# Patient Record
Sex: Male | Born: 1935 | Race: Black or African American | Hispanic: No | Marital: Married | Smoking: Former smoker
Health system: Southern US, Community
[De-identification: ages and names within clinical notes are randomized; demographics above are authoritative.]

## PROBLEM LIST (undated history)

## (undated) DIAGNOSIS — E119 Type 2 diabetes mellitus without complications: Secondary | ICD-10-CM

## (undated) DIAGNOSIS — I10 Essential (primary) hypertension: Secondary | ICD-10-CM

## (undated) DIAGNOSIS — N390 Urinary tract infection, site not specified: Secondary | ICD-10-CM

## (undated) HISTORY — PX: MULTIPLE TOOTH EXTRACTIONS: SHX2053

---

## 2000-01-24 ENCOUNTER — Ambulatory Visit (HOSPITAL_COMMUNITY): Admission: RE | Admit: 2000-01-24 | Discharge: 2000-01-24 | Payer: Self-pay | Admitting: Gastroenterology

## 2000-09-11 ENCOUNTER — Encounter: Payer: Self-pay | Admitting: Emergency Medicine

## 2000-09-11 ENCOUNTER — Emergency Department (HOSPITAL_COMMUNITY): Admission: EM | Admit: 2000-09-11 | Discharge: 2000-09-11 | Payer: Self-pay | Admitting: Emergency Medicine

## 2000-10-29 ENCOUNTER — Encounter: Admission: RE | Admit: 2000-10-29 | Discharge: 2000-10-29 | Payer: Self-pay | Admitting: Family Medicine

## 2000-10-29 ENCOUNTER — Encounter: Payer: Self-pay | Admitting: Family Medicine

## 2004-03-13 ENCOUNTER — Ambulatory Visit (HOSPITAL_COMMUNITY): Admission: RE | Admit: 2004-03-13 | Discharge: 2004-03-13 | Payer: Self-pay | Admitting: Gastroenterology

## 2013-07-11 ENCOUNTER — Encounter (INDEPENDENT_AMBULATORY_CARE_PROVIDER_SITE_OTHER): Payer: Medicare Other | Admitting: Ophthalmology

## 2013-07-11 DIAGNOSIS — E1165 Type 2 diabetes mellitus with hyperglycemia: Secondary | ICD-10-CM

## 2013-07-11 DIAGNOSIS — I10 Essential (primary) hypertension: Secondary | ICD-10-CM

## 2013-07-11 DIAGNOSIS — H35039 Hypertensive retinopathy, unspecified eye: Secondary | ICD-10-CM

## 2013-07-11 DIAGNOSIS — H43819 Vitreous degeneration, unspecified eye: Secondary | ICD-10-CM

## 2013-07-11 DIAGNOSIS — E11319 Type 2 diabetes mellitus with unspecified diabetic retinopathy without macular edema: Secondary | ICD-10-CM

## 2013-07-11 DIAGNOSIS — E1139 Type 2 diabetes mellitus with other diabetic ophthalmic complication: Secondary | ICD-10-CM

## 2013-07-11 DIAGNOSIS — H3530 Unspecified macular degeneration: Secondary | ICD-10-CM

## 2018-01-05 ENCOUNTER — Ambulatory Visit: Payer: Medicare Other | Admitting: Family Medicine

## 2018-01-05 ENCOUNTER — Encounter: Payer: Self-pay | Admitting: Family Medicine

## 2018-01-05 ENCOUNTER — Other Ambulatory Visit: Payer: Self-pay

## 2018-01-05 VITALS — BP 134/80 | HR 71 | Temp 98.4°F | Ht 69.0 in | Wt 210.0 lb

## 2018-01-05 DIAGNOSIS — E119 Type 2 diabetes mellitus without complications: Secondary | ICD-10-CM

## 2018-01-05 DIAGNOSIS — I10 Essential (primary) hypertension: Secondary | ICD-10-CM

## 2018-01-05 MED ORDER — GLIMEPIRIDE 4 MG PO TABS: 4.0000 mg | ORAL_TABLET | Freq: Every day | ORAL | 0 refills | Status: DC

## 2018-01-05 MED ORDER — LOSARTAN POTASSIUM-HCTZ 100-12.5 MG PO TABS: 1.0000 | ORAL_TABLET | Freq: Every day | ORAL | 0 refills | Status: DC

## 2018-01-05 NOTE — Patient Instructions (Addendum)
I will hold on labs today until we can review records from prior provider. Please follow up in next month to review notes and possible labs.   See info on diabetes below. Try low intensity exercise such as walking most days per week.    Type 2 Diabetes Mellitus, Self Care, Adult When you have type 2 diabetes (type 2 diabetes mellitus), you must keep your blood sugar (glucose) under control. You can do this with:  Nutrition.  Exercise.  Lifestyle changes.  Medicines or insulin, if needed.  Support from your doctors and others.  How do I manage my blood sugar?  Check your blood sugar level every day, as often as told.  Call your doctor if your blood sugar is above your goal numbers for 2 tests in a row.  Have your A1c (hemoglobin A1c) level checked at least two times a year. Have it checked more often if your doctor tells you to. Your doctor will set treatment goals for you. Generally, you should have these blood sugar levels:  Before meals (preprandial): 80-130 mg/dL (4.4-7.2 mmol/L).  After meals (postprandial): lower than 180 mg/dL (10 mmol/L).  A1c level: less than 7%.  What do I need to know about high blood sugar? High blood sugar is called hyperglycemia. Know the signs of high blood sugar. Signs may include:  Feeling: ? Thirsty. ? Hungry. ? Very tired.  Needing to pee (urinate) more than usual.  Blurry vision.  What do I need to know about low blood sugar? Low blood sugar is called hypoglycemia. This is when blood sugar is at or below 70 mg/dL (3.9 mmol/L). Symptoms may include:  Feeling: ? Hungry. ? Worried or nervous (anxious). ? Sweaty and clammy. ? Confused. ? Dizzy. ? Sleepy. ? Sick to your stomach (nauseous).  Having: ? A fast heartbeat (palpitations). ? A headache. ? A change in your vision. ? Jerky movements that you cannot control (seizure). ? Nightmares. ? Tingling or no feeling (numbness) around the mouth, lips, or tongue.  Having  trouble with: ? Talking. ? Paying attention (concentrating). ? Moving (coordination). ? Sleeping.  Shaking.  Passing out (fainting).  Getting upset easily (irritability).  Treating low blood sugar  To treat low blood sugar, eat or drink something sugary right away. If you can think clearly and swallow safely, follow the 15:15 rule:  Take 15 grams of a fast-acting carb (carbohydrate). Some fast-acting carbs are: ? 1 tube of glucose gel. ? 3 sugar tablets (glucose pills). ? 6-8 pieces of hard candy. ? 4 oz (120 mL) of fruit juice. ? 4 oz (120 mL) regular (not diet) soda.  Check your blood sugar 15 minutes after you take the carb.  If your blood sugar is still at or below 70 mg/dL (3.9 mmol/L), take 15 grams of a carb again.  If your blood sugar does not go above 70 mg/dL (3.9 mmol/L) after 3 tries, get help right away.  After your blood sugar goes back to normal, eat a meal or a snack within 1 hour.  Treating very low blood sugar If your blood sugar is at or below 54 mg/dL (3 mmol/L), you have very low blood sugar (severe hypoglycemia). This is an emergency. Do not wait to see if the symptoms will go away. Get medical help right away. Call your local emergency services (911 in the [place] Do not drive yourself to the hospital. If you have very low blood sugar and you cannot eat or drink, you may need a  glucagon shot (injection). A family member or friend should learn how to check your blood sugar and how to give you a glucagon shot. Ask your doctor if you need to have a glucagon shot kit at home. What else is important to manage my diabetes? Medicine Follow these instructions about insulin and diabetes medicines:  Take them as told by your doctor.  Adjust them as told by your doctor.  Do not run out of them.  Having diabetes can raise your risk for other long-term conditions. These include heart or kidney disease. Your doctor may prescribe medicines to help prevent problems  from diabetes. Food   Make healthy food choices. These include: ? Chicken, fish, egg whites, and beans. ? Oats, whole wheat, bulgur, brown rice, quinoa, and millet. ? Fresh fruits and vegetables. ? Low-fat dairy products. ? Nuts, avocado, olive oil, and canola oil.  Make a food plan with a specialist (dietitian).  Follow instructions from your doctor about what you cannot eat or drink.  Drink enough fluid to keep your pee (urine) clear or pale yellow.  Eat healthy snacks between healthy meals.  Keep track of carbs that you eat. Read food labels. Learn food serving sizes.  Follow your sick day plan when you cannot eat or drink normally. Make this plan with your doctor so it is ready to use. Activity  Exercise at least 3 times a week.  Do not go more than 2 days without exercising.  Talk with your doctor before you start a new exercise. Your doctor may need to adjust your insulin, medicines, or food. Lifestyle   Do not use any tobacco products. These include cigarettes, chewing tobacco, and e-cigarettes.If you need help quitting, ask your doctor.  Ask your doctor how much alcohol is safe for you.  Learn to deal with stress. If you need help with this, ask your doctor. Body care  Stay up to date with your shots (immunizations).  Have your eyes and feet checked by a doctor as often as told.  Check your skin and feet every day. Check for cuts, bruises, redness, blisters, or sores.  Brush your teeth and gums two times a day.  Floss at least one time a day.  Go to the dentist least one time every 6 months.  Stay at a healthy weight. General instructions   Take over-the-counter and prescription medicines only as told by your doctor.  Share your diabetes care plan with: ? Your work or school. ? People you live with.  Check your pee (urine) for ketones: ? When you are sick. ? As told by your doctor.  Carry a card or wear jewelry that says that you have  diabetes.  Ask your doctor: ? Do I need to meet with a diabetes educator? ? Where can I find a support group for people with diabetes?  Keep all follow-up visits as told by your doctor. This is important. Where to find more information: To learn more about diabetes, visit:  American Diabetes Association: www.diabetes.org  American Association of Diabetes Educators: www.diabeteseducator.org/patient-resources  This information is not intended to replace advice given to you by your health care provider. Make sure you discuss any questions you have with your health care provider. Document Released: 09/24/2015 Document Revised: 11/08/2015 Document Reviewed: 07/06/2015 Elsevier Interactive Patient Education  2018 [employer].    IF you received an x-ray today, you will receive an invoice from [facility]. Please contact [facility] at [phone] with questions or concerns regarding your  invoice.   IF you received labwork today, you will receive an invoice from [place]. Please contact [facility] at [phone] with questions or concerns regarding your invoice.   Our billing staff will not be able to assist you with questions regarding bills from these companies.  You will be contacted with the lab results as soon as they are available. The fastest way to get your results is to activate your My Chart account. Instructions are located on the last page of this paperwork. If you have not heard from [place] regarding the results in 2 weeks, please contact this office.

## 2018-01-05 NOTE — Progress Notes (Signed)
Subjective:  By signing my name below, I, Essence Howell, attest that this documentation has been prepared under the direction and in the presence of Wendie Agreste, MD Electronically Signed: Ladene Artist, ED Scribe 01/05/2018 at 11:32 AM.   Patient ID: Lawrence Higgins, male    DOB: 12-Sep-1933, 84 y.o.   MRN: [id number]  Chief Complaint  Patient presents with  . Medication Refill    glimepiride and losartan-hydrochlorothiazide    HPI Lawrence Higgins is a 84 y.o. male who presents to Primary Care at [facility] for med refill. New pt to me. Has been followed by Dr. Tammi Klippel at [facility] for DM and HTN in 2015, Silvio Pate most recent [place] 07/2014 noted. Appears he has been seen by Dr. Kennon Holter since that time, last visit 2 months ago.  DM A1C had increased from 6.7 to 7.2 in 2016. Diet changes discussed. Planned for 3 month f/u. Continued on janumet 50-500 mg qd. Had been on Zocor for statin started 07/2014 and lisinopril for ACE inhibitor. - Janumet was discontinued due to cost. He is currently on Amaryl 4 mg bid with meals. Pt checks his blood glucose at home with readings averaging 140. Last A1C was ~1 month ago. Denies symptomatic lows, numbness/tingling in LEs. Last saw optho ~1 yr ago. He is not currently exercising.  HTN Prev in 2016 he was on Norvasc 5 mg qd, lisinopril 2.5 mg qd. - Lisinopril was discontinued due to cough. Denies swelling out lips/tongue, any other symptoms. Pt is currently taking Losartan-HCTZ 100-12.5 qd. Denies any side-effects. He has dentures; has not seen a dentist in several yrs.  Pt retired 14 yrs ago from [employer]  There are no active problems to display for this patient.  No past medical history on file.  Allergies  Allergen Reactions  . Tylenol [Acetaminophen] Nausea And Vomiting   Prior to Admission medications   Not on File   Social History   Socioeconomic History  . Marital status: Married    Spouse  name: Not on file  . Number of children: Not on file  . Years of education: Not on file  . Highest education level: Not on file  Occupational History  . Not on file  Social Needs  . Financial resource strain: Not on file  . Food insecurity:    Worry: Not on file    Inability: Not on file  . Transportation needs:    Medical: Not on file    Non-medical: Not on file  Tobacco Use  . Smoking status: Not on file  Substance and Sexual Activity  . Alcohol use: Not on file  . Drug use: Not on file  . Sexual activity: Not on file  Lifestyle  . Physical activity:    Days per week: Not on file    Minutes per session: Not on file  . Stress: Not on file  Relationships  . Social connections:    Talks on phone: Not on file    Gets together: Not on file    Attends religious service: Not on file    Active member of club or organization: Not on file    Attends meetings of clubs or organizations: Not on file    Relationship status: Not on file  . Intimate partner violence:    Fear of current or ex partner: Not on file    Emotionally abused: Not on file    Physically abused: Not on file    Forced  sexual activity: Not on file  Other Topics Concern  . Not on file  Social History Narrative  . Not on file   Review of Systems  Constitutional: Negative for fatigue and unexpected weight change.  Eyes: Negative for visual disturbance.  Respiratory: Negative for cough, chest tightness and shortness of breath.   Cardiovascular: Negative for chest pain, palpitations and leg swelling.  Gastrointestinal: Negative for abdominal pain and blood in stool.  Neurological: Negative for dizziness, light-headedness, numbness and headaches.      Objective:   Physical Exam  Constitutional: He is oriented to person, place, and time. He appears well-developed and well-nourished.  HENT:  Head: Normocephalic and atraumatic.  Eyes: Pupils are equal, round, and reactive to light. EOM are normal.  Neck: No JVD  present. Carotid bruit is not present.  Cardiovascular: Normal rate, regular rhythm and normal heart sounds.  No murmur heard. Pulmonary/Chest: Effort normal and breath sounds normal. He has no rales.  Musculoskeletal: He exhibits no edema.  Neurological: He is alert and oriented to person, place, and time.  Skin: Skin is warm and dry.  Psychiatric: He has a normal mood and affect.  Vitals reviewed.  Vitals:   01/05/18 1103 01/05/18 1109  BP: (!) 154/88 134/80  Pulse: 71   Temp: 98.4 F (36.9 C)   TempSrc: Oral   SpO2: 90%   Weight: 210 lb (95.3 kg)   Height: '5\' 9"'  (1.753 m)       Assessment & Plan:    Lawrence Higgins is a 84 y.o. male Essential hypertension  - controlled. Continue current regimen can review prior records to determine lab need. Recheck in 1 month.   Type 2 diabetes mellitus without complication, without long-term current use of insulin (HCC)  - tolerating regimen without hypoglycemia. Will review prior records to determine needed labs. Handout on self care given. Recheck in 1 month. Low intensity exercise discussed.   Meds ordered this encounter  Medications  . losartan-hydrochlorothiazide (HYZAAR) 100-12.5 MG tablet    Sig: Take 1 tablet by mouth daily.    Dispense:  90 tablet    Refill:  0  . glimepiride (AMARYL) 4 MG tablet    Sig: Take 1 tablet (4 mg total) by mouth daily with breakfast.    Dispense:  180 tablet    Refill:  0   Patient Instructions   I will hold on labs today until we can review records from prior provider. Please follow up in next month to review notes and possible labs.   See info on diabetes below. Try low intensity exercise such as walking most days per week.    Type 2 Diabetes Mellitus, Self Care, Adult When you have type 2 diabetes (type 2 diabetes mellitus), you must keep your blood sugar (glucose) under control. You can do this with:  Nutrition.  Exercise.  Lifestyle changes.  Medicines or insulin, if  needed.  Support from your doctors and others.  How do I manage my blood sugar?  Check your blood sugar level every day, as often as told.  Call your doctor if your blood sugar is above your goal numbers for 2 tests in a row.  Have your A1c (hemoglobin A1c) level checked at least two times a year. Have it checked more often if your doctor tells you to. Your doctor will set treatment goals for you. Generally, you should have these blood sugar levels:  Before meals (preprandial): 80-130 mg/dL (4.4-7.2 mmol/L).  After meals (postprandial): lower than  180 mg/dL (10 mmol/L).  A1c level: less than 7%.  What do I need to know about high blood sugar? High blood sugar is called hyperglycemia. Know the signs of high blood sugar. Signs may include:  Feeling: ? Thirsty. ? Hungry. ? Very tired.  Needing to pee (urinate) more than usual.  Blurry vision.  What do I need to know about low blood sugar? Low blood sugar is called hypoglycemia. This is when blood sugar is at or below 70 mg/dL (3.9 mmol/L). Symptoms may include:  Feeling: ? Hungry. ? Worried or nervous (anxious). ? Sweaty and clammy. ? Confused. ? Dizzy. ? Sleepy. ? Sick to your stomach (nauseous).  Having: ? A fast heartbeat (palpitations). ? A headache. ? A change in your vision. ? Jerky movements that you cannot control (seizure). ? Nightmares. ? Tingling or no feeling (numbness) around the mouth, lips, or tongue.  Having trouble with: ? Talking. ? Paying attention (concentrating). ? Moving (coordination). ? Sleeping.  Shaking.  Passing out (fainting).  Getting upset easily (irritability).  Treating low blood sugar  To treat low blood sugar, eat or drink something sugary right away. If you can think clearly and swallow safely, follow the 15:15 rule:  Take 15 grams of a fast-acting carb (carbohydrate). Some fast-acting carbs are: ? 1 tube of glucose gel. ? 3 sugar tablets (glucose pills). ? 6-8  pieces of hard candy. ? 4 oz (120 mL) of fruit juice. ? 4 oz (120 mL) regular (not diet) soda.  Check your blood sugar 15 minutes after you take the carb.  If your blood sugar is still at or below 70 mg/dL (3.9 mmol/L), take 15 grams of a carb again.  If your blood sugar does not go above 70 mg/dL (3.9 mmol/L) after 3 tries, get help right away.  After your blood sugar goes back to normal, eat a meal or a snack within 1 hour.  Treating very low blood sugar If your blood sugar is at or below 54 mg/dL (3 mmol/L), you have very low blood sugar (severe hypoglycemia). This is an emergency. Do not wait to see if the symptoms will go away. Get medical help right away. Call your local emergency services (911 in the [place] Do not drive yourself to the hospital. If you have very low blood sugar and you cannot eat or drink, you may need a glucagon shot (injection). A family member or friend should learn how to check your blood sugar and how to give you a glucagon shot. Ask your doctor if you need to have a glucagon shot kit at home. What else is important to manage my diabetes? Medicine Follow these instructions about insulin and diabetes medicines:  Take them as told by your doctor.  Adjust them as told by your doctor.  Do not run out of them.  Having diabetes can raise your risk for other long-term conditions. These include heart or kidney disease. Your doctor may prescribe medicines to help prevent problems from diabetes. Food   Make healthy food choices. These include: ? Chicken, fish, egg whites, and beans. ? Oats, whole wheat, bulgur, brown rice, quinoa, and millet. ? Fresh fruits and vegetables. ? Low-fat dairy products. ? Nuts, avocado, olive oil, and canola oil.  Make a food plan with a specialist (dietitian).  Follow instructions from your doctor about what you cannot eat or drink.  Drink enough fluid to keep your pee (urine) clear or pale yellow.  Eat healthy snacks between  healthy meals.  Keep track of carbs that you eat. Read food labels. Learn food serving sizes.  Follow your sick day plan when you cannot eat or drink normally. Make this plan with your doctor so it is ready to use. Activity  Exercise at least 3 times a week.  Do not go more than 2 days without exercising.  Talk with your doctor before you start a new exercise. Your doctor may need to adjust your insulin, medicines, or food. Lifestyle   Do not use any tobacco products. These include cigarettes, chewing tobacco, and e-cigarettes.If you need help quitting, ask your doctor.  Ask your doctor how much alcohol is safe for you.  Learn to deal with stress. If you need help with this, ask your doctor. Body care  Stay up to date with your shots (immunizations).  Have your eyes and feet checked by a doctor as often as told.  Check your skin and feet every day. Check for cuts, bruises, redness, blisters, or sores.  Brush your teeth and gums two times a day.  Floss at least one time a day.  Go to the dentist least one time every 6 months.  Stay at a healthy weight. General instructions   Take over-the-counter and prescription medicines only as told by your doctor.  Share your diabetes care plan with: ? Your work or school. ? People you live with.  Check your pee (urine) for ketones: ? When you are sick. ? As told by your doctor.  Carry a card or wear jewelry that says that you have diabetes.  Ask your doctor: ? Do I need to meet with a diabetes educator? ? Where can I find a support group for people with diabetes?  Keep all follow-up visits as told by your doctor. This is important. Where to find more information: To learn more about diabetes, visit:  American Diabetes Association: www.diabetes.org  American Association of Diabetes Educators: www.diabeteseducator.org/patient-resources  This information is not intended to replace advice given to you by your health care  provider. Make sure you discuss any questions you have with your health care provider. Document Released: 09/24/2015 Document Revised: 11/08/2015 Document Reviewed: 07/06/2015 Elsevier Interactive Patient Education  2018 [employer].    IF you received an x-ray today, you will receive an invoice from [facility]. Please contact [facility] at [phone] with questions or concerns regarding your invoice.   IF you received labwork today, you will receive an invoice from [place]. Please contact [facility] at [phone] with questions or concerns regarding your invoice.   Our billing staff will not be able to assist you with questions regarding bills from these companies.  You will be contacted with the lab results as soon as they are available. The fastest way to get your results is to activate your My Chart account. Instructions are located on the last page of this paperwork. If you have not heard from [place] regarding the results in 2 weeks, please contact this office.       I personally performed the services described in this documentation, which was scribed in my presence. The recorded information has been reviewed and considered for accuracy and completeness, addended by me as needed, and agree with information above.  Signed,   Merri Ray, MD [facility] at [place].  01/08/18 8:14 AM

## 2018-02-04 ENCOUNTER — Other Ambulatory Visit: Payer: Self-pay

## 2018-02-04 ENCOUNTER — Encounter: Payer: Self-pay | Admitting: Family Medicine

## 2018-02-04 ENCOUNTER — Ambulatory Visit: Payer: Medicare Other | Admitting: Family Medicine

## 2018-02-04 VITALS — BP 133/73 | HR 80 | Temp 98.4°F | Ht 68.0 in | Wt 212.4 lb

## 2018-02-04 DIAGNOSIS — E119 Type 2 diabetes mellitus without complications: Secondary | ICD-10-CM

## 2018-02-04 DIAGNOSIS — I10 Essential (primary) hypertension: Secondary | ICD-10-CM | POA: Diagnosis not present

## 2018-02-04 DIAGNOSIS — Z23 Encounter for immunization: Secondary | ICD-10-CM

## 2018-02-04 DIAGNOSIS — Z794 Long term (current) use of insulin: Secondary | ICD-10-CM | POA: Diagnosis not present

## 2018-02-04 NOTE — Patient Instructions (Addendum)
Thank you for coming back in today.  I will try to request your lab work from previous provider again.  If you are able to go by and obtain that lab work as well as recent visits, that will help me determine timing of next blood work.  I likely can put that in as a lab only order with follow-up in the next 3 to 4 months.  Let me know if there are questions prior to that time.    If you have lab work done today you will be contacted with your lab results within the next 2 weeks.  If you have not heard from us then please contact us. The fastest way to get your results is to register for My Chart.   IF you received an x-ray today, you will receive an invoice from [facility]. Please contact [facility] at [phone] with questions or concerns regarding your invoice.   IF you received labwork today, you will receive an invoice from [facility]. Please contact [facility] at [phone] with questions or concerns regarding your invoice.   Our billing staff will not be able to assist you with questions regarding bills from these companies.  You will be contacted with the lab results as soon as they are available. The fastest way to get your results is to activate your My Chart account. Instructions are located on the last page of this paperwork. If you have not heard from us regarding the results in 2 weeks, please contact this office.

## 2018-02-04 NOTE — Progress Notes (Signed)
Subjective:    Patient ID: Lawrence Higgins, male    DOB: 07-09-1933, 84 y.o.   MRN: [id number]  HPI Lawrence Higgins is a 84 y.o. male Presents today for: Chief Complaint  Patient presents with  . Chronic condition    1 month f/u on BP and Diabetes with med review   Here for follow-up.  Last seen July 23 for establishing care.  History of diabetes and hypertension.  Diabetes: He was taking 4 mg Amaryl twice per day with meals at last visit, Janumet was cost prohibitive.  Reported lab work approximately 1 month prior. Home readings 130-140.  Rare 170, no symptomatic lows.  No known retinopathy (appt 6 months ago), no other known complications.  Declines flu shot, agrees to pneumonia vaccine - never had.  Plans to obtain records for me to review last labs - (about 2 months ago).   Hypertension: BP Readings from Last 3 Encounters:  02/04/18 133/73  01/05/18 134/80   No results found for: CREATININE  Previously on ACE inhibitor, but discontinued due to cough.  He was taking losartan HCTZ 100/12.5 mg daily last visit.   Heath maintenance.  Agrees to prevnar.   Review of Systems     Objective:   Physical Exam  Constitutional: He is oriented to person, place, and time. He appears well-developed and well-nourished.  HENT:  Head: Normocephalic and atraumatic.  Eyes: Pupils are equal, round, and reactive to light. EOM are normal.  Neck: No JVD present. Carotid bruit is not present.  Cardiovascular: Normal rate, regular rhythm and normal heart sounds.  No murmur heard. Pulmonary/Chest: Effort normal and breath sounds normal. He has no rales.  Musculoskeletal: He exhibits no edema.  Neurological: He is alert and oriented to person, place, and time.  Skin: Skin is warm and dry.  Psychiatric: He has a normal mood and affect.  Vitals reviewed.   Vitals:   02/04/18 1506  BP: 133/73  Pulse: 80  Temp: 98.4 F (36.9 C)  TempSrc: Oral  SpO2: 92%  Weight: 212 lb 6.4 oz (96.3 kg)    Height: 5\' 8"  (1.727 m)      Assessment & Plan:  Lawrence Higgins is a [age] y.o. male Type 2 diabetes mellitus without complication, with long-term current use of insulin (HCC)  -No change in regimen at this time.  We will try again to obtain records from previous provider.  Depending on those records, can place a lab only visit for lab work with office visit for follow-up in 3 to 4 months.  HTN   - Stable, tolerating current regimen.  Labs to be determined by outside records.   Need for prophylactic vaccination against Streptococcus pneumoniae (pneumococcus) - Plan: Pneumococcal conjugate vaccine 13-valent IM  -Prevnar given.  Plan on Pneumovax at future visit.  No orders of the defined types were placed in this encounter.  Patient Instructions   Thank you for coming back in today.  I will try to request your lab work from previous provider again.  If you are able to go by and obtain that lab work as well as recent visits, that will help me determine timing of next blood work.  I likely can put that in as a lab only order with follow-up in the next 3 to 4 months.  Let me know if there are questions prior to that time.    If you have lab work done today you will be contacted with your lab results within the next 2  weeks.  If you have not heard from [place] then please contact us. The fastest way to get your results is to register for My Chart.   IF you received an x-ray today, you will receive an invoice from [facility]. Please contact [facility] at [phone] with questions or concerns regarding your invoice.   IF you received labwork today, you will receive an invoice from [facility]. Please contact [facility] at [phone] with questions or concerns regarding your invoice.   Our billing staff will not be able to assist you with questions regarding bills from these companies.  You will be contacted with the lab results as soon as they are available. The fastest way to get  your results is to activate your My Chart account. Instructions are located on the last page of this paperwork. If you have not heard from [place] regarding the results in 2 weeks, please contact this office.      Signed,   Meredith Staggers, MD Primary Care at [facility].  02/04/18 7:00 PM

## 2018-02-14 DIAGNOSIS — N390 Urinary tract infection, site not specified: Secondary | ICD-10-CM

## 2018-02-14 HISTORY — DX: Urinary tract infection, site not specified: N39.0

## 2018-03-01 ENCOUNTER — Observation Stay (HOSPITAL_COMMUNITY): Payer: Medicare Other

## 2018-03-01 ENCOUNTER — Encounter (HOSPITAL_COMMUNITY): Payer: Self-pay | Admitting: *Deleted

## 2018-03-01 ENCOUNTER — Inpatient Hospital Stay (HOSPITAL_COMMUNITY)
Admission: EM | Admit: 2018-03-01 | Discharge: 2018-03-04 | DRG: 690 | Disposition: A | Discharge status: Home / Self Care | Payer: Medicare Other | Attending: Student in an Organized Health Care Education/Training Program | Admitting: Student in an Organized Health Care Education/Training Program

## 2018-03-01 DIAGNOSIS — N320 Bladder-neck obstruction: Secondary | ICD-10-CM | POA: Diagnosis present

## 2018-03-01 DIAGNOSIS — B961 Klebsiella pneumoniae [K. pneumoniae] as the cause of diseases classified elsewhere: Secondary | ICD-10-CM | POA: Diagnosis present

## 2018-03-01 DIAGNOSIS — R739 Hyperglycemia, unspecified: Secondary | ICD-10-CM | POA: Diagnosis present

## 2018-03-01 DIAGNOSIS — N390 Urinary tract infection, site not specified: Principal | ICD-10-CM | POA: Diagnosis present

## 2018-03-01 DIAGNOSIS — I10 Essential (primary) hypertension: Secondary | ICD-10-CM | POA: Diagnosis present

## 2018-03-01 DIAGNOSIS — N138 Other obstructive and reflux uropathy: Secondary | ICD-10-CM | POA: Diagnosis present

## 2018-03-01 DIAGNOSIS — E119 Type 2 diabetes mellitus without complications: Secondary | ICD-10-CM

## 2018-03-01 DIAGNOSIS — R7989 Other specified abnormal findings of blood chemistry: Secondary | ICD-10-CM | POA: Diagnosis present

## 2018-03-01 DIAGNOSIS — Z8249 Family history of ischemic heart disease and other diseases of the circulatory system: Secondary | ICD-10-CM

## 2018-03-01 DIAGNOSIS — Z1611 Resistance to penicillins: Secondary | ICD-10-CM | POA: Diagnosis present

## 2018-03-01 DIAGNOSIS — N179 Acute kidney failure, unspecified: Secondary | ICD-10-CM | POA: Diagnosis present

## 2018-03-01 DIAGNOSIS — N401 Enlarged prostate with lower urinary tract symptoms: Secondary | ICD-10-CM | POA: Diagnosis present

## 2018-03-01 DIAGNOSIS — Z833 Family history of diabetes mellitus: Secondary | ICD-10-CM

## 2018-03-01 DIAGNOSIS — E1165 Type 2 diabetes mellitus with hyperglycemia: Secondary | ICD-10-CM | POA: Diagnosis present

## 2018-03-01 DIAGNOSIS — Z888 Allergy status to other drugs, medicaments and biological substances status: Secondary | ICD-10-CM

## 2018-03-01 HISTORY — DX: Urinary tract infection, site not specified: N39.0

## 2018-03-01 HISTORY — DX: Type 2 diabetes mellitus without complications: E11.9

## 2018-03-01 HISTORY — DX: Essential (primary) hypertension: I10

## 2018-03-01 LAB — BASIC METABOLIC PANEL
Anion gap: 14 (ref 5–15)
Anion gap: 10 (ref 5–15)
BUN: 43 mg/dL — AB (ref 8–23)
BUN: 39 mg/dL — ABNORMAL HIGH (ref 8–23)
CALCIUM: 9.1 mg/dL (ref 8.9–10.3)
CHLORIDE: 103 mmol/L (ref 98–111)
CO2: 22 mmol/L (ref 22–32)
CO2: 24 mmol/L (ref 22–32)
CREATININE: 2.67 mg/dL — AB (ref 0.61–1.24)
Calcium: 8.3 mg/dL — ABNORMAL LOW (ref 8.9–10.3)
Chloride: 99 mmol/L (ref 98–111)
Creatinine, Ser: 2.3 mg/dL — ABNORMAL HIGH (ref 0.61–1.24)
GFR calc Af Amer: 24 mL/min — ABNORMAL LOW (ref >60)
GFR calc non Af Amer: 21 mL/min — ABNORMAL LOW (ref >60)
GFR calc non Af Amer: 25 mL/min — ABNORMAL LOW (ref >60)
GFR, EST AFRICAN AMERICAN: 29 mL/min — AB (ref >60)
GLUCOSE: 338 mg/dL — AB (ref 70–99)
Glucose, Bld: 265 mg/dL — ABNORMAL HIGH (ref 70–99)
POTASSIUM: 4.5 mmol/L (ref 3.5–5.1)
Potassium: 4.7 mmol/L (ref 3.5–5.1)
SODIUM: 137 mmol/L (ref 135–145)
Sodium: 135 mmol/L (ref 135–145)

## 2018-03-01 LAB — CBC
HCT: 40.6 % (ref 39.0–52.0)
Hemoglobin: 13 g/dL (ref 13.0–17.0)
MCH: 30.7 pg (ref 26.0–34.0)
MCHC: 32 g/dL (ref 30.0–36.0)
MCV: 95.8 fL (ref 78.0–100.0)
PLATELETS: 213 10*3/uL (ref 150–400)
RBC: 4.24 MIL/uL (ref 4.22–5.81)
RDW: 13.3 % (ref 11.5–15.5)
WBC: 28.8 10*3/uL — ABNORMAL HIGH (ref 4.0–10.5)

## 2018-03-01 LAB — URINALYSIS, ROUTINE W REFLEX MICROSCOPIC
BILIRUBIN URINE: NEGATIVE
GLUCOSE, UA: 50 mg/dL — AB
KETONES UR: NEGATIVE mg/dL
Nitrite: NEGATIVE
PH: 5 (ref 5.0–8.0)
Protein, ur: NEGATIVE mg/dL
Specific Gravity, Urine: 1.013 (ref 1.005–1.030)
WBC, UA: >50 WBC/hpf — ABNORMAL HIGH (ref 0–5)

## 2018-03-01 LAB — SODIUM, URINE, RANDOM: Sodium, Ur: 19 mmol/L

## 2018-03-01 LAB — CBG MONITORING, ED: Glucose-Capillary: 312 mg/dL — ABNORMAL HIGH (ref 70–99)

## 2018-03-01 LAB — GLUCOSE, CAPILLARY
Glucose-Capillary: 309 mg/dL — ABNORMAL HIGH (ref 70–99)
Glucose-Capillary: 237 mg/dL — ABNORMAL HIGH (ref 70–99)

## 2018-03-01 LAB — CREATININE, URINE, RANDOM: Creatinine, Urine: 176.42 mg/dL

## 2018-03-01 MED ORDER — INSULIN ASPART 100 UNIT/ML ~~LOC~~ SOLN
0.0000 [IU] | Freq: Three times a day (TID) | SUBCUTANEOUS | Status: DC
  Administered 2018-03-01: 7 [IU] via SUBCUTANEOUS
  Administered 2018-03-02: 2 [IU] via SUBCUTANEOUS
  Administered 2018-03-02: 5 [IU] via SUBCUTANEOUS
  Administered 2018-03-02 – 2018-03-03 (×2): 3 [IU] via SUBCUTANEOUS
  Administered 2018-03-03: 2 [IU] via SUBCUTANEOUS
  Administered 2018-03-03: 5 [IU] via SUBCUTANEOUS
  Administered 2018-03-04 (×2): 2 [IU] via SUBCUTANEOUS

## 2018-03-01 MED ORDER — HEPARIN SODIUM (PORCINE) 5000 UNIT/ML IJ SOLN
5000.0000 [IU] | Freq: Three times a day (TID) | INTRAMUSCULAR | Status: DC
  Administered 2018-03-02 – 2018-03-04 (×7): 5000 [IU] via SUBCUTANEOUS
  Filled 2018-03-01 (×7): qty 1

## 2018-03-01 MED ORDER — SODIUM CHLORIDE 0.9 % IV BOLUS
1000.0000 mL | Freq: Once | INTRAVENOUS | Status: AC
  Administered 2018-03-01: 1000 mL via INTRAVENOUS

## 2018-03-01 MED ORDER — SODIUM CHLORIDE 0.9 % IV SOLN
2.0000 g | Freq: Once | INTRAVENOUS | Status: AC
  Administered 2018-03-01: 2 g via INTRAVENOUS
  Filled 2018-03-01: qty 20

## 2018-03-01 MED ORDER — SODIUM CHLORIDE 0.9 % IV SOLN
1.0000 g | INTRAVENOUS | Status: DC
  Administered 2018-03-02 – 2018-03-03 (×2): 1 g via INTRAVENOUS
  Filled 2018-03-01 (×3): qty 10

## 2018-03-01 MED ORDER — INSULIN ASPART 100 UNIT/ML ~~LOC~~ SOLN
0.0000 [IU] | Freq: Every day | SUBCUTANEOUS | Status: DC
  Administered 2018-03-01: 2 [IU] via SUBCUTANEOUS

## 2018-03-01 MED ORDER — SODIUM CHLORIDE 0.9 % IV SOLN
INTRAVENOUS | Status: DC
  Administered 2018-03-01 – 2018-03-02 (×2): via INTRAVENOUS

## 2018-03-01 MED ORDER — PANTOPRAZOLE SODIUM 20 MG PO TBEC
20.0000 mg | DELAYED_RELEASE_TABLET | Freq: Every day | ORAL | Status: DC | PRN
  Filled 2018-03-01: qty 1

## 2018-03-01 MED ORDER — INSULIN GLARGINE 100 UNIT/ML ~~LOC~~ SOLN
5.0000 [IU] | Freq: Every day | SUBCUTANEOUS | Status: DC
  Administered 2018-03-01: 5 [IU] via SUBCUTANEOUS
  Filled 2018-03-01: qty 0.05

## 2018-03-01 NOTE — ED Notes (Signed)
Pt 90% on RA. Pt denies hx of COPD. Pt placed on 2L [place].

## 2018-03-01 NOTE — ED Notes (Signed)
Attempted report 

## 2018-03-01 NOTE — ED Provider Notes (Signed)
[facility] Emergency Department Provider Note MRN:  [id number]  Arrival date & time: 03/01/18     Chief Complaint   Hyperglycemia   History of Present Illness   Lawrence Higgins is a 84 y.o. year-old male with a history of hypertension, diabetes presenting to the ED with chief complaint of hyperglycemia.  For 3 days, patient's blood sugars have been elevated more than normal.  Typical blood sugars are around 100, controlled at home with just metformin.  Sugars have been persistently in the 300s for the past 3 days.  Patient endorsing subjective fevers, denies cough, no rash, no shortness of breath, no abdominal pain.  Endorsing frequent urination and burning with urination.  Denies diarrhea.  Review of Systems  A complete 10 system review of systems was obtained and all systems are negative except as noted in the HPI and PMH.   Patient's Health History    Past Medical History:  Diagnosis Date  . Diabetes mellitus without complication (HCC)   . Hypertension     History reviewed. No pertinent surgical history.  History reviewed. No pertinent family history.  Social History   Socioeconomic History  . Marital status: Married    Spouse name: Not on file  . Number of children: Not on file  . Years of education: Not on file  . Highest education level: Not on file  Occupational History  . Not on file  Social Needs  . Financial resource strain: Not on file  . Food insecurity:    Worry: Not on file    Inability: Not on file  . Transportation needs:    Medical: Not on file    Non-medical: Not on file  Tobacco Use  . Smoking status: Former Smoker    Last attempt to quit: 02/04/1978    Years since quitting: 40.0  . Smokeless tobacco: Never Used  Substance and Sexual Activity  . Alcohol use: Not on file  . Drug use: Not on file  . Sexual activity: Not on file  Lifestyle  . Physical activity:    Days per week: Not on file    Minutes per session: Not on file  .  Stress: Not on file  Relationships  . Social connections:    Talks on phone: Not on file    Gets together: Not on file    Attends religious service: Not on file    Active member of club or organization: Not on file    Attends meetings of clubs or organizations: Not on file    Relationship status: Not on file  . Intimate partner violence:    Fear of current or ex partner: Not on file    Emotionally abused: Not on file    Physically abused: Not on file    Forced sexual activity: Not on file  Other Topics Concern  . Not on file  Social History Narrative  . Not on file     Physical Exam  Vital Signs and Nursing Notes reviewed Vitals:   03/01/18 1235 03/01/18 1345  BP: 132/80 122/82  Pulse: (!) 102 (!) 104  Resp: 18 (!) 26  Temp:    SpO2: 93% 94%    CONSTITUTIONAL: Well-appearing, NAD NEURO:  Alert and oriented x 3, no focal deficits EYES:  eyes equal and reactive ENT/NECK:  no LAD, no JVD CARDIO: Tachycardic rate, well-perfused, normal S1 and S2 PULM:  CTAB no wheezing or rhonchi GI/GU:  normal bowel sounds, non-distended, non-tender MSK/SPINE:  No gross deformities,  no edema SKIN:  no rash, atraumatic PSYCH:  Appropriate speech and behavior  Diagnostic and Interventional Summary    EKG Interpretation  Date/Time:    Ventricular Rate:    PR Interval:    QRS Duration:   QT Interval:    QTC Calculation:   R Axis:     Text Interpretation:        Labs Reviewed  BASIC METABOLIC PANEL - Abnormal; Notable for the following components:      Result Value   Glucose, Bld 338 (*)    BUN 43 (*)    Creatinine, Ser 2.67 (*)    GFR calc non Af Amer 21 (*)    GFR calc Af Amer 24 (*)    All other components within normal limits  CBC - Abnormal; Notable for the following components:   WBC 28.8 (*)    All other components within normal limits  URINALYSIS, ROUTINE W REFLEX MICROSCOPIC - Abnormal; Notable for the following components:   APPearance CLOUDY (*)    Glucose, UA  50 (*)    Hgb urine dipstick MODERATE (*)    Leukocytes, UA LARGE (*)    WBC, UA >50 (*)    Bacteria, UA MANY (*)    All other components within normal limits  CBG MONITORING, ED - Abnormal; Notable for the following components:   Glucose-Capillary 312 (*)    All other components within normal limits  SODIUM, URINE, RANDOM  CREATININE, URINE, RANDOM    No orders to display    Medications  sodium chloride 0.9 % bolus 1,000 mL (has no administration in time range)  cefTRIAXone (ROCEPHIN) 2 g in sodium chloride 0.9 % 100 mL IVPB (2 g Intravenous New Bag/Given 03/01/18 1417)  sodium chloride 0.9 % bolus 1,000 mL (1,000 mLs Intravenous New Bag/Given 03/01/18 1416)     Procedures Critical Care  ED Course and Medical Decision Making  I have reviewed the triage vital signs and the nursing notes.  Pertinent labs & imaging results that were available during my care of the patient were reviewed by me and considered in my medical decision making (see below for details).  Hyperglycemia likely due to the urinary tract infection in this 84 year old male, history of diabetes with recent dysuria.  Will confirm with labs and urinalysis, reassess.  May be appropriate for discharge on antibiotics.  Clinical Course as of Mar 01 1524  Mon Mar 01, 2018  1315 Labs reveal significant leukocytosis, concerning for AKI.  Will bladder scan to rule out obstructive cause, will admit to medicine service after UA is obtained.   [MB]    Clinical Course User Index [MB] Sabas SousBero, Desma Wilkowski M, MD     UA consistent with infection, given IV ceftriaxone, 2 L crystalloid, admitted to medicine service for further care.  Elmer SowMichael M. Pilar PlateBero, MD [facility] [email or web address]  Final Clinical Impressions(s) / ED Diagnoses     ICD-10-CM   1. Lower urinary tract infectious disease N39.0   2. AKI (acute kidney injury) [facility](HCC) N17.9     ED Discharge Orders    None           Sabas SousBero, Jerrye Seebeck M, MD 03/01/18 1525

## 2018-03-01 NOTE — H&P (Addendum)
Date: 03/01/2018               Patient Name:  Lawrence Higgins MRN: [id number]  DOB: 1933-07-30 Age / Sex: 84 y.o., male   PCP: Shade Flood, MD              Medical Service: Internal Medicine Teaching Service              Attending Physician: Dr. Erlinda Hong    First Contact: Thurmon Fair, MS4 Pager: [phone]  Second Contact: Dr. Crista Elliot Pager: [phone]            After Hours (After 5p/  First Contact Pager: [phone]  weekends / holidays): Second Contact Pager: [phone]   Chief Complaint: "High sugars and burning when I urinate"  History of Present Illness: Lawrence Higgins is a 84 y.o. M with DM2 and HTN who presents with hyperglycemia and burning when he urinates. The patient was in his usual state of health until approximately 3 weeks prior. He had been to see his PCP without notable medication changes. He reports his blood glucose is normally 145 but has progressively increased over the last 2 to 3 weeks to now near 300-400 despite his compliance with glimepiride and dietary control. Patient denied dietary indiscretion or changes in his diet. He has experienced polyuria, polydipsia and dysuria over the past week. Patient reports a TURP procedure almost 30 years prior but denies urinary hesitancy or frequency since that time.  He has felt weak and unstable walking in the last few weeks in association with his high blood glucose noted on his home glucometer. He denies any flank pain, chills, pyuria, and hematuria. Patient endorsed associated subjective fever, fatigue and mild generalized weakness.  In the ED the patient was noted to have an elevated BUN to 47 and creatine to 2.47. Patient admitted for further work-up of his elevated creatinine, BUN,  and presenting symptoms concerning for a UTI and acute renal injury.   Meds: Current Meds  Medication Sig  . glimepiride (AMARYL) 4 MG tablet Take 1 tablet (4 mg total) by mouth daily with breakfast.  . losartan-hydrochlorothiazide (HYZAAR)  100-12.5 MG tablet Take 1 tablet by mouth daily.  . pantoprazole (PROTONIX) 20 MG tablet Take 20 mg by mouth daily as needed for heartburn or indigestion.    Allergies: Allergies as of 03/01/2018 - Review Complete 03/01/2018  Allergen Reaction Noted  . Tylenol [acetaminophen] Nausea And Vomiting 01/05/2018  . Lisinopril Cough 01/05/2018   Past Medical History:  Diagnosis Date  . Diabetes mellitus without complication (HCC)   . Hypertension     Family History:  Patient denied known family history of HTN, DMII.  Social History:  Patient denied EtOH, tobacco or unprescribed substance use Lives: lives at home with his wife Ravis Herne [phone]   Review of Systems: Review of Systems  Constitutional: Positive for fever. Negative for chills.  HENT: Negative for congestion and sore throat.   Eyes: Negative for blurred vision and double vision.  Respiratory: Negative for cough and shortness of breath.   Cardiovascular: Negative for chest pain and leg swelling.  Gastrointestinal: Negative for abdominal pain and constipation.  Genitourinary: Positive for dysuria and frequency. Negative for flank pain and hematuria.  Skin: Negative for itching and rash.  Neurological: Positive for weakness. Negative for tingling and focal weakness.    Physical Exam: Blood pressure (!) 159/85, pulse 90, temperature 98 F (36.7 C), temperature source Oral, resp. rate (!) 23, SpO2 93 %. BP (!) 159/85  Pulse 90   Temp 98 F (36.7 C) (Oral)   Resp (!) 23   SpO2 93%    Physical Exam Gen: NAD, Alert , cooperative, in no acute distress HENT: [employer]/AT, trachea midline, no JVD . Dry mucous membranes.  Eyes: PERRL, EOM's Intact Heart: RRR, no mrg Lungs: CTAB, no w/r/r Abdomen: active bowel sounds, soft, NT Ext: no edema, cyanosis , clubbing Skin: warm , dry Neuro: Nl strength throughout, AOx4 Psych: Mood and affect appropriate. Patient mildly anxious  CXR: Not obtained   EKG: Not  obtained  Assessment & Plan by Problem: Active Problems:   UTI (urinary tract infection)   Acute renal injury (HCC)   Hyperglycemia  Lawrence Higgins is a 84 y.o. M with a PMHx notable for DM2 and HTN who presents w/ elevated serum creatine likely secondary to dehydration from 2-3 weeks of hyperglycemia which is complicated by a probable urinary tract infection.   Assessment and Plan: Elevated Creatinine and BUN: (BUN 43 , Creatinine 2.67) In the setting of progressive diabetes with rapid excellertion of his serum glucose levels for several weeks w/ increase urinary frequency this past week. Probable AKI, but baseline renal function unknown, will continue IV fluids and repeat BMP to monitor for improvement. Patient is able to urinate freely but given his history of BPH with TURP procedure we will complete a renal ultrasound. -Continue IV fluids @ 13200ml/hr overnight -Repeat BMP at 2000 hours -CMP in am -Renal US ordered  Acute simple cystits: Patient presents with increased frequency and dysuria for 1 week. Although possibly 2/2 to increased concentration of his urine due to dehydration from the hyperglycemia, given his UA findings a UTI is highly possible as well. Also, there is a notable Leukocytosis of 28.8k. Unlikely to be pyelonephritis given his lack of defining systemic symptoms aside from the leukocytosis. U/A positive for leukocytes, bacteria, WBC's but negative for nitrites. The patient is afebrile, denied chills or rigors, costovertebral tenderness exam negative, denying flank pain. - Urine culture ordered, this was obtained at ~ the same time that his antibiotics were initiated - Ceftriaxone 1mg  Q24 hours dosing - CBC in am  Hyperglycemia 2/2 newly uncontrolled DM2:   Patient with DM2 without complications and not previously on insulin. He presented with blood glucose of 338. At home his glucose levels are reportedly 130-140, but over the last two weeks they have been as high as 300-400.  His current home regimen is Glimepiride 4 mg BID which we will hold tonight.  - Holding home Glimepiride  - SSI sensitive to better assess insulin need - Lantus 5U QHS - CBG WC and QHS  HTN -Will hold home losartan-HCTZ 100-12.5 due to acute renal injury. Patient's BP may be high tonight.   Diet: HH/CM Code: Full Fluids: 17000ml/hr DVT PPX: Heparin Signed: Lanelle BalHarbrecht, Eagan Shifflett, MD 03/01/2018, 6:29 PM

## 2018-03-01 NOTE — ED Notes (Signed)
Lab to add on urine sodium and creatinine to sample down in lab.

## 2018-03-01 NOTE — Progress Notes (Signed)
Patient admitted to [facility] room 27 from the E.D. Alert and oriented. Denies any pain. Oriented to room. Call bell in reach.

## 2018-03-01 NOTE — ED Triage Notes (Signed)
Pt in c/o hyperglycemia at home the last 2 weeks, pt states it has been ranging 300-400 which is abnormal for him, he normally stays in the low 100, feels like he is "drunk", denies pain, denies cough or other symptoms

## 2018-03-02 ENCOUNTER — Other Ambulatory Visit: Payer: Self-pay

## 2018-03-02 ENCOUNTER — Encounter (HOSPITAL_COMMUNITY): Payer: Self-pay | Admitting: General Practice

## 2018-03-02 DIAGNOSIS — B961 Klebsiella pneumoniae [K. pneumoniae] as the cause of diseases classified elsewhere: Secondary | ICD-10-CM

## 2018-03-02 DIAGNOSIS — Z7984 Long term (current) use of oral hypoglycemic drugs: Secondary | ICD-10-CM

## 2018-03-02 DIAGNOSIS — R7989 Other specified abnormal findings of blood chemistry: Secondary | ICD-10-CM | POA: Diagnosis present

## 2018-03-02 DIAGNOSIS — Z960 Presence of urogenital implants: Secondary | ICD-10-CM

## 2018-03-02 DIAGNOSIS — E1165 Type 2 diabetes mellitus with hyperglycemia: Secondary | ICD-10-CM

## 2018-03-02 DIAGNOSIS — N179 Acute kidney failure, unspecified: Secondary | ICD-10-CM

## 2018-03-02 DIAGNOSIS — N320 Bladder-neck obstruction: Secondary | ICD-10-CM

## 2018-03-02 DIAGNOSIS — Z8249 Family history of ischemic heart disease and other diseases of the circulatory system: Secondary | ICD-10-CM | POA: Diagnosis not present

## 2018-03-02 DIAGNOSIS — I10 Essential (primary) hypertension: Secondary | ICD-10-CM | POA: Diagnosis present

## 2018-03-02 DIAGNOSIS — Z79899 Other long term (current) drug therapy: Secondary | ICD-10-CM

## 2018-03-02 DIAGNOSIS — N138 Other obstructive and reflux uropathy: Secondary | ICD-10-CM | POA: Diagnosis present

## 2018-03-02 DIAGNOSIS — Z9079 Acquired absence of other genital organ(s): Secondary | ICD-10-CM

## 2018-03-02 DIAGNOSIS — E119 Type 2 diabetes mellitus without complications: Secondary | ICD-10-CM

## 2018-03-02 DIAGNOSIS — Z1611 Resistance to penicillins: Secondary | ICD-10-CM | POA: Diagnosis present

## 2018-03-02 DIAGNOSIS — Z888 Allergy status to other drugs, medicaments and biological substances status: Secondary | ICD-10-CM | POA: Diagnosis not present

## 2018-03-02 DIAGNOSIS — N390 Urinary tract infection, site not specified: Principal | ICD-10-CM

## 2018-03-02 DIAGNOSIS — Z833 Family history of diabetes mellitus: Secondary | ICD-10-CM | POA: Diagnosis not present

## 2018-03-02 DIAGNOSIS — N401 Enlarged prostate with lower urinary tract symptoms: Secondary | ICD-10-CM | POA: Diagnosis present

## 2018-03-02 LAB — CBC
HCT: 35.3 % — ABNORMAL LOW (ref 39.0–52.0)
HEMOGLOBIN: 11.7 g/dL — AB (ref 13.0–17.0)
MCH: 31.1 pg (ref 26.0–34.0)
MCHC: 33.1 g/dL (ref 30.0–36.0)
MCV: 93.9 fL (ref 78.0–100.0)
Platelets: 179 10*3/uL (ref 150–400)
RBC: 3.76 MIL/uL — AB (ref 4.22–5.81)
RDW: 13.6 % (ref 11.5–15.5)
WBC: 21.5 10*3/uL — AB (ref 4.0–10.5)

## 2018-03-02 LAB — GLUCOSE, CAPILLARY
GLUCOSE-CAPILLARY: 220 mg/dL — AB (ref 70–99)
GLUCOSE-CAPILLARY: 248 mg/dL — AB (ref 70–99)
GLUCOSE-CAPILLARY: 182 mg/dL — AB (ref 70–99)
GLUCOSE-CAPILLARY: 190 mg/dL — AB (ref 70–99)

## 2018-03-02 LAB — COMPREHENSIVE METABOLIC PANEL
ALT: 41 U/L (ref 0–44)
ANION GAP: 12 (ref 5–15)
AST: 24 U/L (ref 15–41)
Albumin: 2.5 g/dL — ABNORMAL LOW (ref 3.5–5.0)
Alkaline Phosphatase: 63 U/L (ref 38–126)
BILIRUBIN TOTAL: 1.3 mg/dL — AB (ref 0.3–1.2)
BUN: 34 mg/dL — ABNORMAL HIGH (ref 8–23)
CALCIUM: 8.4 mg/dL — AB (ref 8.9–10.3)
CO2: 22 mmol/L (ref 22–32)
Chloride: 103 mmol/L (ref 98–111)
Creatinine, Ser: 1.98 mg/dL — ABNORMAL HIGH (ref 0.61–1.24)
GFR, EST AFRICAN AMERICAN: 35 mL/min — AB (ref >60)
GFR, EST NON AFRICAN AMERICAN: 30 mL/min — AB (ref >60)
Glucose, Bld: 230 mg/dL — ABNORMAL HIGH (ref 70–99)
Potassium: 4.4 mmol/L (ref 3.5–5.1)
SODIUM: 137 mmol/L (ref 135–145)
TOTAL PROTEIN: 7.1 g/dL (ref 6.5–8.1)

## 2018-03-02 MED ORDER — INSULIN GLARGINE 100 UNIT/ML ~~LOC~~ SOLN
7.0000 [IU] | Freq: Every day | SUBCUTANEOUS | Status: DC
  Administered 2018-03-02 – 2018-03-03 (×2): 7 [IU] via SUBCUTANEOUS
  Filled 2018-03-02 (×3): qty 0.07

## 2018-03-02 MED ORDER — TAMSULOSIN HCL 0.4 MG PO CAPS
0.4000 mg | ORAL_CAPSULE | Freq: Every day | ORAL | Status: DC
  Administered 2018-03-02 – 2018-03-04 (×3): 0.4 mg via ORAL
  Filled 2018-03-02 (×3): qty 1

## 2018-03-02 NOTE — Plan of Care (Signed)
  Problem: Pain Managment: Goal: General experience of comfort will improve Outcome: Progressing   Problem: Safety: Goal: Ability to remain free from injury will improve Outcome: Progressing   Problem: Skin Integrity: Goal: Risk for impaired skin integrity will decrease Outcome: Progressing   

## 2018-03-02 NOTE — Progress Notes (Addendum)
   Subjective:  Lawrence Higgins is resting in bed on exam. He reports feeling better , but not back to baseline. He no longer is experiencing burning when he urinates. Denies being aware of a problem with urinary flow since his TURP procedure 30 years ago. Walking in his room, still feels weaker than his usual.  Objective:  Vital signs in last 24 hours: Vitals:   03/01/18 1831 03/01/18 2151 03/01/18 2358 03/02/18 0514  BP: (!) 157/77 118/70  134/77  Pulse:  (!) 110  (!) 101  Resp:  18  16  Temp:  (!) 100.9 F (38.3 C) 100.2 F (37.9 C) 98.4 F (36.9 C)  TempSrc:  Oral Oral Oral  SpO2:  91%  92%   Weight change:   Intake/Output Summary (Last 24 hours) at 03/02/2018 1254 Last data filed at 03/02/2018 1148 Gross per 24 hour  Intake 3354.7 ml  Output 675 ml  Net 2679.7 ml   Physical Exam Gen: NAD, resting in bed Heart: RRR, no mrg Abdomen: No suprapubic tenderness, nl bowel sounds Ext: No LE edema, clubbing ,cyanosis  Assessment/Plan:  Active Problems:   UTI (urinary tract infection)   Acute renal injury (HCC)   Hyperglycemia  84 year old with complicated UTI due to bladder outlet obstruction.    Assessment and Plan:   Acute Complicated UTI Patient was febrile to 100.9 overnight. He is on Ceftriaxone, says his dysuria is resolving, and leukocytosis down trending from 28 to 21 today. Cultures growing >100K Klebsiella Pneumoniae. Susceptibilities pending.  - continue Ceftriaxone 1mg  Q24hr dosing - follow susceptibilities  - CBC in the morning  Bladder Outlet Obstruction:  Post Void Residual volume of 900 mL on bladder scanner. This is high risk for bladder stretch injury and the biggest predisposing factor to the UTI itself. Would place foley for bladder decompression. Please record the amount of urine initially out. I don't think the BOO is causing the AKI because there was no hydronephrosis on renal ultrasound. Start flomax for medical therapy prostate enlargement. He will need  urology office follow up in 4 weeks for trial of removing the catheter.  AKI Patient given IV NS overnight and creatinine trending down from 2.67 to 1.98. FeNa 2%. Initial impression is patient is volume down from polyuria in setting of uncontrolled DM2. No hydronephrosis seen on Renal US. We do not know if he has chronic renal disease. - stop fluids , encourage PO intake  Hyperglycemia 2/2 newly uncontrolled DM2:   Glucose 220 this AM. Will continue to monitor and adjust.  - Holding home Glimepiride  - SSI sensitive to better assess insulin need - Lantus 7U QHS - CBG WC and QHS  HTN -Will hold home losartan-HCTZ 100-12.5 due to acute renal injury.   Diet: HH/CM Code: Full Fluids: PO DVT PPX: Heparin   LOS: 0 days   Gardenia PhlegmSteen, James J, Medical Student 03/02/2018, 12:54 PM    Attestation for Student Documentation:  I personally was present and performed or re-performed the history, physical exam and medical decision-making activities of this service and have verified that the service and findings are accurately documented in the student's note. I have made necessary changes to this note.  Tyson AliasVincent, Duncan Thomas, MD 03/02/2018, 2:13 PM

## 2018-03-02 NOTE — Evaluation (Signed)
Physical Therapy Evaluation and Discharge Patient Details Name: Lawrence Higgins MRN: [id number] DOB: 10-02-1933 Today's Date: 03/02/2018   History of Present Illness  Pt is an 84 y/o male admitted secondary to hyperglycemia. Pt's UA also consistent with UTI. Renal US negative for acute abnormality. PMH includes HTN and DM.   Clinical Impression  Patient evaluated by Physical Therapy with no further acute PT needs identified. All education has been completed and the patient has no further questions. Pt overall steady requiring supervision for safety. No LOB noted and reports he is at his baseline for ambulation. Pt reports wife will be able to assist at home. Recommend continued ambulation with mobility tech during hospital stay. See below for any follow-up Physical Therapy or equipment needs. PT is signing off. Thank you for this referral. If needs change, please reconsult.      Follow Up Recommendations No PT follow up    Equipment Recommendations  None recommended by PT    Recommendations for Other Services       Precautions / Restrictions Precautions Precautions: None Restrictions Weight Bearing Restrictions: No      Mobility  Bed Mobility Overal bed mobility: Modified Independent                Transfers Overall transfer level: Needs assistance Equipment used: None Transfers: Sit to/from Stand Sit to Stand: Supervision         General transfer comment: Supervision for safety. No physical assist required.   Ambulation/Gait Ambulation/Gait assistance: Supervision Gait Distance (Feet): 200 Feet Assistive device: None Gait Pattern/deviations: Step-through pattern Gait velocity: decreased   General Gait Details: Overall steady gait. No overt LOB noted and pt reports he is at baseline ambulation.   Stairs Stairs: Yes       General stair comments: Verbally reviewed safe stair management. Pt able to perform marches in place without LOB demonstrating adquate  mechanics for step clearance.   Wheelchair Mobility    Modified Rankin (Stroke Patients Only)       Balance Overall balance assessment: Needs assistance Sitting-balance support: No upper extremity supported;Feet supported Sitting balance-Leahy Scale: Normal     Standing balance support: No upper extremity supported;During functional activity Standing balance-Leahy Scale: Good                               Pertinent Vitals/Pain Pain Assessment: No/denies pain    Home Living Family/patient expects to be discharged to:: Private residence Living Arrangements: Spouse/significant other Available Help at Discharge: Family;Available 24 hours/day Type of Home: House Home Access: Stairs to enter Entrance Stairs-Rails: Right Entrance Stairs-Number of Steps: 3 Home Layout: One level Home Equipment: None      Prior Function Level of Independence: Independent               Hand Dominance   Dominant Hand: Right    Extremity/Trunk Assessment   Upper Extremity Assessment Upper Extremity Assessment: Overall WFL for tasks assessed    Lower Extremity Assessment Lower Extremity Assessment: Overall WFL for tasks assessed    Cervical / Trunk Assessment Cervical / Trunk Assessment: Normal  Communication   Communication: No difficulties  Cognition Arousal/Alertness: Awake/alert Behavior During Therapy: WFL for tasks assessed/performed Overall Cognitive Status: Within Functional Limits for tasks assessed  General Comments General comments (skin integrity, edema, etc.): Pt reports he does not feel like he needs follow up PT     Exercises     Assessment/Plan    PT Assessment Patent does not need any further PT services  PT Problem List         PT Treatment Interventions      PT Goals (Current goals can be found in the Care Plan section)  Acute Rehab PT Goals Patient Stated Goal: to go home  PT  Goal Formulation: With patient Time For Goal Achievement: 03/02/18 Potential to Achieve Goals: Good    Frequency     Barriers to discharge        Co-evaluation               AM-PAC PT "6 Clicks" Daily Activity  Outcome Measure Difficulty turning over in bed (including adjusting bedclothes, sheets and blankets)?: None Difficulty moving from lying on back to sitting on the side of the bed? : A Little Difficulty sitting down on and standing up from a chair with arms (e.g., wheelchair, bedside commode, etc,.)?: None Help needed moving to and from a bed to chair (including a wheelchair)?: None Help needed walking in hospital room?: None Help needed climbing 3-5 steps with a railing? : A Little 6 Click Score: 22    End of Session Equipment Utilized During Treatment: Gait belt Activity Tolerance: Patient tolerated treatment well Patient left: in chair;with call bell/phone within reach Nurse Communication: Mobility status PT Visit Diagnosis: Other abnormalities of gait and mobility (R26.89)    Time: 4098-1191 PT Time Calculation (min) (ACUTE ONLY): 15 min   Charges:   PT Evaluation $PT Eval Low Complexity: 1 Low          Gladys Damme, PT, DPT  Acute Rehabilitation Services  Pager: [phone] Office: [phone]   Lawrence Higgins 03/02/2018, 1:02 PM

## 2018-03-03 DIAGNOSIS — E1169 Type 2 diabetes mellitus with other specified complication: Secondary | ICD-10-CM

## 2018-03-03 DIAGNOSIS — Z1611 Resistance to penicillins: Secondary | ICD-10-CM

## 2018-03-03 LAB — HEMOGLOBIN A1C
HEMOGLOBIN A1C: 10.1 % — AB (ref 4.8–5.6)
Mean Plasma Glucose: 243 mg/dL

## 2018-03-03 LAB — RENAL FUNCTION PANEL
ALBUMIN: 2.5 g/dL — AB (ref 3.5–5.0)
Anion gap: 13 (ref 5–15)
BUN: 25 mg/dL — AB (ref 8–23)
CALCIUM: 8.4 mg/dL — AB (ref 8.9–10.3)
CO2: 23 mmol/L (ref 22–32)
Chloride: 101 mmol/L (ref 98–111)
Creatinine, Ser: 1.57 mg/dL — ABNORMAL HIGH (ref 0.61–1.24)
GFR calc Af Amer: 46 mL/min — ABNORMAL LOW (ref >60)
GFR, EST NON AFRICAN AMERICAN: 40 mL/min — AB (ref >60)
GLUCOSE: 183 mg/dL — AB (ref 70–99)
PHOSPHORUS: 3 mg/dL (ref 2.5–4.6)
Potassium: 4.4 mmol/L (ref 3.5–5.1)
Sodium: 137 mmol/L (ref 135–145)

## 2018-03-03 LAB — GLUCOSE, CAPILLARY
Glucose-Capillary: 174 mg/dL — ABNORMAL HIGH (ref 70–99)
Glucose-Capillary: 258 mg/dL — ABNORMAL HIGH (ref 70–99)
Glucose-Capillary: 204 mg/dL — ABNORMAL HIGH (ref 70–99)
Glucose-Capillary: 164 mg/dL — ABNORMAL HIGH (ref 70–99)

## 2018-03-03 LAB — URINE CULTURE

## 2018-03-03 LAB — CBC
HCT: 36.9 % — ABNORMAL LOW (ref 39.0–52.0)
Hemoglobin: 11.9 g/dL — ABNORMAL LOW (ref 13.0–17.0)
MCH: 30.4 pg (ref 26.0–34.0)
MCHC: 32.2 g/dL (ref 30.0–36.0)
MCV: 94.4 fL (ref 78.0–100.0)
PLATELETS: 188 10*3/uL (ref 150–400)
RBC: 3.91 MIL/uL — ABNORMAL LOW (ref 4.22–5.81)
RDW: 13.5 % (ref 11.5–15.5)
WBC: 17.9 10*3/uL — ABNORMAL HIGH (ref 4.0–10.5)

## 2018-03-03 MED ORDER — SODIUM CHLORIDE 0.9 % IV SOLN
INTRAVENOUS | Status: DC | PRN
  Administered 2018-03-03: 250 mL via INTRAVENOUS

## 2018-03-03 NOTE — Care Management Note (Signed)
Case Management Note  Patient Details  Name: Blanchie DessertDavid Edison MRN: [id number] Date of Birth: Dec 17, 1933  Subjective/Objective:                    Action/Plan:  Sent benefits check for Lantus 7 units sq daily .  Expected Discharge Date:                  Expected Discharge Plan:  Home/Self Care  In-House Referral:     Discharge planning Services  CM Consult, Medication Assistance  Post Acute Care Choice:    Choice offered to:     DME Arranged:    DME Agency:     HH Arranged:    HH Agency:     Status of Service:  In process, will continue to follow  If discussed at Long Length of Stay Meetings, dates discussed:    Additional Comments:  Kingsley PlanWile, Jathen Sudano Marie, RN 03/03/2018, 12:46 PM

## 2018-03-03 NOTE — Care Management (Signed)
03-03-18  BENEFITS CHECK :  # 2.   S/W  KIKE  @ [employer] # [phone]  LANTUS  7 UNITS SQ DAILY X 30 DAYS COVER- YES CO-PAY- $ 45.00 TIER- 3 DRUG PRIOR APPROVAL- NO  PREFERRED PHARMACY : YES [facility] AND [facility]

## 2018-03-03 NOTE — Progress Notes (Addendum)
Subjective:  Lawrence Higgins is sitting on side of bed eating breakfast on exam. He reports doing well overnight and was able to ambulate with PT yesterday. Still feels more weak than usual, but overall improving.   Objective:  Vital signs in last 24 hours: Vitals:   03/02/18 0514 03/02/18 1441 03/02/18 2108 03/03/18 0523  BP: 134/77 (!) 143/86 133/75 138/79  Pulse: (!) 101 (!) 103 (!) 107 (!) 101  Resp: 16  17 20   Temp: 98.4 F (36.9 C) 98.9 F (37.2 C) (!) 100.4 F (38 C) 99.7 F (37.6 C)  TempSrc: Oral Oral Oral Oral  SpO2: 92% 97% 92% 92%   Weight change:   Intake/Output Summary (Last 24 hours) at 03/03/2018 1041 Last data filed at 03/03/2018 0523 Gross per 24 hour  Intake 160 ml  Output 2200 ml  Net -2040 ml   Physical Exam Gen: sitting on side of bed, NAD Heart: RRR, no mrg Lungs: CTAB, no w/r/g Ext: no lower extremitie edema , cyanosis, or clubbing Skin: warm and dry  9/16 Urine Cx : > 100,00 Klebsiella Pneumonia,                             Klebsiella pneumoniae   MIC   AMPICILLIN RESISTANT  Resistant   AMPICILLIN/SULBACTAM <=2 SENSITIVE  Sensitive   CEFAZOLIN <=4 SENSITIVE  Sensitive   CEFTRIAXONE <=1 SENSITIVE  Sensitive   CIPROFLOXACIN <=0.25 SENS... Sensitive   Extended ESBL NEGATIVE  Sensitive   GENTAMICIN <=1 SENSITIVE  Sensitive   IMIPENEM <=0.25 SENS... Sensitive   NITROFURANTOIN 64 INTERMED... Intermediate   PIP/TAZO <=4 SENSITIVE  Sensitive   TRIMETH/SULFA <=20 SENSIT... Sensitive     Assessment/Plan:  Principal Problem:   UTI (urinary tract infection) Active Problems:   Acute renal injury (HCC)   Diabetes mellitus (HCC)  84 y.o. with acute complicated UTI due to bladder obstruction.    Acute Complicated UTI due to Klebsiella Pneumonia: Patient was febrile to 100.4 overnight. K. Pneumonia susceptible to ceftriaxone. Leukocytosis downtrending  28>21>17. Bladder outlet obstruction and uncontrolled diabetes are predisposing factors. Will  transition antibiotic to cephalexin tomorrow.  Bladder Outlet Obstruction: Foley catheter placed for BOO and he put out 2.45L overnight. Plan is to continue foley catheter for bladder decompression for at least 4 weeks. Nursing to please teach him about catheter care and provide ankle bag. Started flomax yesterday. Please arrange for outpatient urology follow up. He is having some post-obstructive diuresis, will monitor urine output and may need to add IV fluids to keep up.  AKI due to obstruction: Creatinine continuing to downtrend 2.67>1.98>1.57. ( Creatinine in 2016 1.12)  DM2   Hgb A1c 10.1 on this admission. Patient will likely need to go home on Insulin. CBG 174 this morning.  - Can restart glimepiride.  - continue Lantus 7U and SSI. Patient does not want to use lantus at home.   HTN - will hold home losartan-HCTZ 100-12.5 due to acute renal injury  Diet: HH/CM Code: Full Fluids: PO DVT PPX: Heparin    LOS: 1 day   Gardenia PhlegmSteen, James J, Medical Student 03/03/2018, 10:41 AM    Attestation for Student Documentation:  I personally was present and performed or re-performed the history, physical exam and medical decision-making activities of this service and have verified that the service and findings are accurately documented in the student's note. I have made necessary changes to this note.  Tyson AliasVincent, Pacer Dorn Thomas, MD 03/03/2018, 2:10 PM

## 2018-03-03 NOTE — Progress Notes (Signed)
Inpatient Diabetes Program Recommendations  AACE/ADA: New Consensus Statement on Inpatient Glycemic Control (2015)  Target Ranges:  Prepandial:   less than 140 mg/dL      Peak postprandial:   less than 180 mg/dL (1-2 hours)      Critically ill patients:  140 - 180 mg/dL   Spoke with patient about diabetes and home regimen for diabetes control. Patient reports that he last saw his PCP 1 month ago and no changes were made at that time and his glucose levels were 140-170 range. Patient reports the last 2 weeks his glucose has been elevated over 300.   Patient reports checking his glucose every morning and takes his medications regularly. I discussed A1c levels, 10.1% at this time and brought up that the MD may bring up insulin at d/c, one shot a day. Patient would like to avoid that at this time. Patient is not wondering if the infection is making his numbers off.  Patient feels more comfortable monitoring his glucose at home for another week or so and then calling his PCP about the insulin injections. Patient is very knowledgeable and I feel would follow up as mentioned.  Asked patient to check his glucose in the evening time as well. Patient verbalized understanding of information discussed and he states that he has no further questions at this time related to diabetes.   Thanks,  Christena DeemShannon Darleny Sem RN, MSN, BC-ADM Inpatient Diabetes Coordinator Team Pager [phone] (8a-5p)

## 2018-03-03 NOTE — Progress Notes (Signed)
Inpatient Diabetes Program Recommendations  AACE/ADA: New Consensus Statement on Inpatient Glycemic Control (2015)  Target Ranges:  Prepandial:   less than 140 mg/dL      Peak postprandial:   less than 180 mg/dL (1-2 hours)      Critically ill patients:  140 - 180 mg/dL   Results for Lawrence Higgins, Lawrence Higgins (MRN [id number]) as of 03/03/2018 11:35  Ref. Range 03/02/2018 06:18  Hemoglobin A1C Latest Ref Range: 4.8 - 5.6 % 10.1 (H)   Review of Glycemic Control  Diabetes history: DM 2 Outpatient Diabetes medications: Glimepiride 4 mg Daily Current orders for Inpatient glycemic control: Lantus 5 units, Novolog 0-9 units tid, Novolog 0-5 units qhs. A1c in process. Lantus increased to 7 units.  Inpatient Diabetes Program Recommendations:    A1c 10.1%, uncontrolled at home. Due to age patient may benefit from basal insulin at time of d/c.  Thanks,  Christena DeemShannon Etta Gassett RN, MSN, BC-ADM Inpatient Diabetes Coordinator Team Pager [phone] (8a-5p)

## 2018-03-04 DIAGNOSIS — Z888 Allergy status to other drugs, medicaments and biological substances status: Secondary | ICD-10-CM

## 2018-03-04 LAB — BASIC METABOLIC PANEL
Anion gap: 6 (ref 5–15)
BUN: 22 mg/dL (ref 8–23)
CO2: 29 mmol/L (ref 22–32)
Calcium: 8.6 mg/dL — ABNORMAL LOW (ref 8.9–10.3)
Chloride: 103 mmol/L (ref 98–111)
Creatinine, Ser: 1.54 mg/dL — ABNORMAL HIGH (ref 0.61–1.24)
GFR calc Af Amer: 47 mL/min — ABNORMAL LOW (ref >60)
GFR, EST NON AFRICAN AMERICAN: 41 mL/min — AB (ref >60)
GLUCOSE: 162 mg/dL — AB (ref 70–99)
POTASSIUM: 4.5 mmol/L (ref 3.5–5.1)
Sodium: 138 mmol/L (ref 135–145)

## 2018-03-04 LAB — GLUCOSE, CAPILLARY
Glucose-Capillary: 161 mg/dL — ABNORMAL HIGH (ref 70–99)
Glucose-Capillary: 177 mg/dL — ABNORMAL HIGH (ref 70–99)

## 2018-03-04 MED ORDER — SODIUM CHLORIDE 0.9 % IV SOLN
1.0000 g | INTRAVENOUS | Status: AC
  Administered 2018-03-04: 1 g via INTRAVENOUS
  Filled 2018-03-04: qty 10

## 2018-03-04 MED ORDER — CEPHALEXIN 500 MG PO CAPS: 500.0000 mg | ORAL_CAPSULE | Freq: Four times a day (QID) | ORAL | 0 refills | Status: DC

## 2018-03-04 MED ORDER — TAMSULOSIN HCL 0.4 MG PO CAPS: 0.4000 mg | ORAL_CAPSULE | Freq: Every day | ORAL | 1 refills | Status: DC

## 2018-03-04 NOTE — Discharge Instructions (Signed)
Please discuss continuing your combination blood pressure medication with your primary care provider when you see him next. We held this due to relative normotension on admission off of the medication and a mildly increased blood level of a protein called creatinine. We recommend that he recheck that lab prior to restarting the blood pressure medication combo tablet.

## 2018-03-04 NOTE — Plan of Care (Signed)
  Problem: Health Behavior/Discharge Planning: Goal: Ability to manage health-related needs will improve 03/04/2018 1452 by Darreld Mcleanox, Porfirio Bollier, RN Outcome: Adequate for Discharge 03/04/2018 1143 by Darreld Mcleanox, Taeler Winning, RN Outcome: Progressing   Problem: Clinical Measurements: Goal: Ability to maintain clinical measurements within normal limits will improve 03/04/2018 1452 by Darreld Mcleanox, Tabia Landowski, RN Outcome: Adequate for Discharge 03/04/2018 1143 by Darreld Mcleanox, Marbeth Smedley, RN Outcome: Progressing Goal: Will remain free from infection 03/04/2018 1452 by Darreld Mcleanox, Griselda Bramblett, RN Outcome: Adequate for Discharge 03/04/2018 1143 by Darreld Mcleanox, Dalya Maselli, RN Outcome: Progressing Goal: Diagnostic test results will improve 03/04/2018 1452 by Darreld Mcleanox, Merrit Waugh, RN Outcome: Adequate for Discharge 03/04/2018 1143 by Darreld Mcleanox, Woodie Trusty, RN Outcome: Progressing Goal: Respiratory complications will improve 03/04/2018 1452 by Darreld Mcleanox, Maelani Yarbro, RN Outcome: Adequate for Discharge 03/04/2018 1143 by Darreld Mcleanox, Thurza Kwiecinski, RN Outcome: Progressing Goal: Cardiovascular complication will be avoided 03/04/2018 1452 by Darreld Mcleanox, Jazzmen Restivo, RN Outcome: Adequate for Discharge 03/04/2018 1143 by Darreld Mcleanox, Londyn Wotton, RN Outcome: Progressing   Problem: Activity: Goal: Risk for activity intolerance will decrease 03/04/2018 1452 by Darreld Mcleanox, Essam Lowdermilk, RN Outcome: Adequate for Discharge 03/04/2018 1143 by Darreld Mcleanox, Eleena Grater, RN Outcome: Progressing   Problem: Nutrition: Goal: Adequate nutrition will be maintained 03/04/2018 1452 by Darreld Mcleanox, Dodger Sinning, RN Outcome: Adequate for Discharge 03/04/2018 1143 by Darreld Mcleanox, Vonn Sliger, RN Outcome: Progressing   Problem: Coping: Goal: Level of anxiety will decrease 03/04/2018 1452 by Darreld Mcleanox, Irais Mottram, RN Outcome: Adequate for Discharge 03/04/2018 1143 by Darreld Mcleanox, Jamaal Bernasconi, RN Outcome: Progressing   Problem: Elimination: Goal: Will not experience complications related to bowel motility 03/04/2018 1452 by Darreld Mcleanox, Kyrah Schiro, RN Outcome: Adequate for Discharge 03/04/2018 1143 by Darreld Mcleanox, Sadie Pickar, RN Outcome: Progressing Goal: Will not experience  complications related to urinary retention 03/04/2018 1452 by Darreld Mcleanox, Breandan People, RN Outcome: Adequate for Discharge 03/04/2018 1143 by Darreld Mcleanox, Maeleigh Buschman, RN Outcome: Progressing   Problem: Pain Managment: Goal: General experience of comfort will improve 03/04/2018 1452 by Darreld Mcleanox, Linsi Humann, RN Outcome: Adequate for Discharge 03/04/2018 1143 by Darreld Mcleanox, Zadrian Mccauley, RN Outcome: Progressing   Problem: Safety: Goal: Ability to remain free from injury will improve 03/04/2018 1452 by Darreld Mcleanox, Erick Oxendine, RN Outcome: Adequate for Discharge 03/04/2018 1143 by Darreld Mcleanox, Mykael Trott, RN Outcome: Progressing   Problem: Skin Integrity: Goal: Risk for impaired skin integrity will decrease 03/04/2018 1452 by Darreld Mcleanox, Breeanne Oblinger, RN Outcome: Adequate for Discharge 03/04/2018 1143 by Darreld Mcleanox, Doyle Kunath, RN Outcome: Progressing

## 2018-03-04 NOTE — Plan of Care (Signed)

## 2018-03-04 NOTE — Discharge Summary (Signed)
Name: Lawrence Higgins MRN: [id number] DOB: 08/17/1933 84 y.o. PCP: Shade Flood, MD  Date of Admission: 03/01/2018 10:57 AM Date of Discharge: 03/04/2018 Attending Physician: Dr. Erlinda Hong  Discharge Diagnosis: 1. Acute Complicated UTI due to Klebsiella Pneumonia 2. Bladder Outlet Obstruction 3. AKI due to obstruction  Discharge Medications: Allergies as of 03/04/2018      Reactions   Tylenol [acetaminophen] Nausea And Vomiting   Lisinopril Cough      Medication List    STOP taking these medications   losartan-hydrochlorothiazide 100-12.5 MG tablet Commonly known as:  HYZAAR     TAKE these medications   cephALEXin 500 MG capsule Commonly known as:  KEFLEX Take 1 capsule (500 mg total) by mouth 4 (four) times daily for 10 days.   glimepiride 4 MG tablet Commonly known as:  AMARYL Take 1 tablet (4 mg total) by mouth daily with breakfast.   pantoprazole 20 MG tablet Commonly known as:  PROTONIX Take 20 mg by mouth daily as needed for heartburn or indigestion.   tamsulosin 0.4 MG Caps capsule Commonly known as:  FLOMAX Take 1 capsule (0.4 mg total) by mouth daily. Start taking on:  03/05/2018       Disposition and follow-up:   Lawrence Higgins was discharged from [facility] in Stable condition.  At the hospital follow up visit please address:  1.  Acute Complicated UTI due to Klebsiella Pneumonia and post renal obstruction: Symptoms resolved at discharge, patient discharged with 7 days of cephalexin to complete his treatment course.  2. Bladder Outlet Obstruction: . Scheduled follow-up with [facility] on Oct 3rd to remove foley. Continue Flomax.  3. DM2: A1c 10.1%. On 7U Lantus in hospital, however, the patient opted to discuss treatment with his PCP and therefor chose to forgo insulin.   4.  Labs / imaging needed at time of follow-up: BMP for Scr monitoring   5.  Pending labs/ test needing follow-up: n/a  Follow-up  Appointments: Follow-up Information    Shade Flood, MD Follow up in 1 week(s).   Specialties:  Family Medicine, Sports Medicine Contact information: [address] [place] [address] [phone]        [facility] Follow up.   Why:  Call to confirm your October 2nd appointment at 2:30pm with [facility]  Contact information: [address] [place] [address] [place] [address] [phone]          Hospital Course by problem list:  Acute Complicated UTI due to Klebsiella Pneumonia: Patient came in with dysuria. Spiked fever to 100.9 on first night of admission. Symptoms improved with antibiotic treatment and relief of BOO. Pt received 4 days of IV ceftriaxone before discharge. Discharged home on Cephalexin 500mg  BID x 7 days.  Bladder Outlet Obstruction: Patient initially denied symptoms concerning for obstruction. However, bladder scan indicated 900 ml post void residual. Renal US was unremarkable. Patient  reports TURP procedure approximately 30 years prior. He is being discharged home with foley catheter and ankle bag. Started on flomax and arranged follow up with [facility] on Oct 3rd.   AKI due to obstruction: Patient presented with creatine azotemia. Improved with 3L of IV fluids and foley catheter placement. Felt to be post obstructive. Mild post obstructive diuresis noted. Transitioned to oral fluids and with creatinine decreased to 1.54 by the day of discharge.   DMII: Hgb A1c 10.1 on this admission. CBG's controlled with 7U lantus while hospitalized. Restarted Amaryl on discharge.  He was given instructions to continue to monitor glucose dailey and will follow-up with his PCP in a week.   Discharge Vitals:   BP 137/85 (BP Location: Left Arm)   Pulse 100   Temp 98.6 F (37 C) (Oral)   Resp 10   Ht 5\' 8"  (1.727 m)   Wt 92.4 kg   SpO2 96%   BMI 30.97 kg/m   Pertinent Labs, Studies, and Procedures:   CBC Latest Ref Rng &  Units 03/03/2018 03/02/2018 03/01/2018  WBC 4.0 - 10.5 K/uL 17.9(H) 21.5(H) 28.8(H)  Hemoglobin 13.0 - 17.0 g/dL 11.9(L) 11.7(L) 13.0  Hematocrit 39.0 - 52.0 % 36.9(L) 35.3(L) 40.6  Platelets 150 - 400 K/uL 188 179 213   Lab Results  Component Value Date   CREATININE 1.54 (H) 03/04/2018   CREATININE 1.57 (H) 03/03/2018   CREATININE 1.98 (H) 03/02/2018   US Renal  Right Kidney: Length: 10.2 cm. Echogenicity within normal limits. No mass or hydronephrosis visualized. Left Kidney: Length: 9.1 cm. Echogenicity within normal limits. No mass or hydronephrosis visualized. Bladder:Appears normal for degree of bladder distention. BILATERAL ureteral jets are observed.  IMPRESSION: Negative exam. Bladder scan: voided 100ml, 900ml post-void residual   Discharge Instructions: Discharge Instructions    Call MD for:  persistant nausea and vomiting   Complete by:  As directed    Call MD for:  redness, tenderness, or signs of infection (pain, swelling, redness, odor or Guglielmo/yellow discharge around incision site)   Complete by:  As directed    Call MD for:  temperature >100.4   Complete by:  As directed    Diet - low sodium heart healthy   Complete by:  As directed    Discharge instructions   Complete by:  As directed    We recommend that you consider utilizing once daily insulin of 7 units of lantus.   In addition, you will need to keep the foley catheter in place until you meet with Urology or at least four weeks.   We are sending you home with oral antibiotics. Please take these as prescribed until completed.   Increase activity slowly   Complete by:  As directed       Signed: Gardenia PhlegmSteen, James J, Medical Student 03/04/2018, 3:51 PM

## 2018-03-04 NOTE — Progress Notes (Addendum)
   Subjective:  Lawrence Higgins is resting in bed on exam. He continues to improve and reports his strength is returning. He is in agreement with being discharged home today although reluctant to go with the foley catheter.   Objective:  Vital signs in last 24 hours: Vitals:   03/03/18 0523 03/03/18 1430 03/03/18 1945 03/03/18 2108  BP: 138/79 127/86  137/85  Pulse: (!) 101 100  100  Resp: 20 20  10   Temp: 99.7 F (37.6 C) 98.6 F (37 C)  98.6 F (37 C)  TempSrc: Oral Oral  Oral  SpO2: 92% 94%  96%  Weight:   92.4 kg   Height:   5\' 8"  (1.727 m)    Weight change:   Intake/Output Summary (Last 24 hours) at 03/04/2018 1145 Last data filed at 03/04/2018 0900 Gross per 24 hour  Intake 423.8 ml  Output 1900 ml  Net -1476.2 ml   Physical Exam Gen: NAD, resting in bed Heart: RRR, no mrg Lungs: CTAB, no w/r/r Ext : No edema. Dry, warm  Assessment/Plan:  Principal Problem:   UTI (urinary tract infection) Active Problems:   Acute renal injury (HCC)   Diabetes mellitus (HCC)   84 y.o. Male w/ a PMH notable for diabetes, HTN and prior BPH who presented with an acute complicated UTI due to bladder obstruction and hyperglycemia.  Acute Complicated UTI due to Klebsiella Pneumonia: Patient afebrile overnight. Will have received four days of IV ceftriaxone before discharge. Will send patient home on cephalexin to complete a ~10 day course of treatment. - Cephalexin 500mg  BID x 7 days - Follow-up with PCP  Bladder Outlet Obstruction: Pt had 1.6 L of urine output in the last 24 hrs. Continued to encourage PO intake. He will go home with foley catheter and ankle bag. Patients nurse has agreed to instruct the patient with regard to catheter care before discharge. Sending him home on flomax. Advised patient to arrange follow up in 1-2 weeks with his PCP and will attempt to assist with appointment in 4 weeks with urology although he will need to follow-up with this as well.   AKI due to  obstruction: Creatinine trended down . 2.67>1.98>1.57>1.54. Recommending that his PCP order a repeat BMET in one week. (Creatinine in 2016 1.12)  DM2: Hgb A1c 10.1 on this admission. Patient does not wish to go home on Insulin despite education as to the importance of continuing this medication. He was given instructions to continue to monitor glucose dailey and will follow-up with his PCP in a week while continuing his glimepiride.   Diet: HH/CM Code: Full Fluids: PO DVT PPX: Heparin   LOS: 2 days   Gardenia PhlegmSteen, James J, Medical Student 03/04/2018, 11:45 AM   Attestation for Student Documentation:  I personally was present and performed or re-performed the history, physical exam and medical decision-making activities of this service and have verified that the service and findings are accurately documented in the student's note with the above noted corrections.   Lanelle BalHarbrecht, Charizma Gardiner, MD 03/04/2018, 2:07 PM

## 2018-03-05 ENCOUNTER — Telehealth: Payer: Self-pay | Admitting: Internal Medicine

## 2018-03-05 MED ORDER — CEPHALEXIN 500 MG PO CAPS: 500.0000 mg | ORAL_CAPSULE | Freq: Two times a day (BID) | ORAL | 0 refills | Status: AC

## 2018-03-05 NOTE — Telephone Encounter (Signed)
Called patient to inform him that the Keflex should be taken every 12 hours and not every 6 hours as initially prescribed until September 27th. He requested that this be relayed to his wife. She was able to confirm this verbally with read back.  Lanelle BalLawrence Minda Faas, MD Internal Medicine PGY-2 Pager # [phone]

## 2018-03-10 ENCOUNTER — Telehealth: Payer: Self-pay | Admitting: *Deleted

## 2018-03-10 NOTE — Telephone Encounter (Signed)
Patient came in wanting to know if she should take losartan.  Patient discharge notes from hospital said to stop taking and discuss with pcp do to his elevated creatinine and bp in hospital.     Patient was advised to follow instructions of hospital discharge.  Will go to [facility] to check BP and watch for symptoms of blood pressure rising before appointment

## 2018-03-12 ENCOUNTER — Ambulatory Visit: Payer: Medicare Other | Admitting: Family Medicine

## 2018-03-12 ENCOUNTER — Encounter: Payer: Self-pay | Admitting: Family Medicine

## 2018-03-12 ENCOUNTER — Other Ambulatory Visit: Payer: Self-pay

## 2018-03-12 VITALS — BP 122/70 | HR 72 | Temp 98.4°F | Resp 18 | Ht 68.0 in | Wt 202.4 lb

## 2018-03-12 DIAGNOSIS — N179 Acute kidney failure, unspecified: Secondary | ICD-10-CM

## 2018-03-12 DIAGNOSIS — B961 Klebsiella pneumoniae [K. pneumoniae] as the cause of diseases classified elsewhere: Secondary | ICD-10-CM

## 2018-03-12 DIAGNOSIS — E1165 Type 2 diabetes mellitus with hyperglycemia: Secondary | ICD-10-CM

## 2018-03-12 DIAGNOSIS — I10 Essential (primary) hypertension: Secondary | ICD-10-CM | POA: Diagnosis not present

## 2018-03-12 DIAGNOSIS — N390 Urinary tract infection, site not specified: Secondary | ICD-10-CM | POA: Diagnosis not present

## 2018-03-12 DIAGNOSIS — B9689 Other specified bacterial agents as the cause of diseases classified elsewhere: Secondary | ICD-10-CM

## 2018-03-12 MED ORDER — LOSARTAN POTASSIUM-HCTZ 50-12.5 MG PO TABS: 1.0000 | ORAL_TABLET | Freq: Every day | ORAL | 3 refills | Status: DC

## 2018-03-12 NOTE — Progress Notes (Signed)
Subjective:  By signing my name below, I, Essence Howell, attest that this documentation has been prepared under the direction and in the presence of Shade Flood, MD Electronically Signed: Charline Bills, ED Scribe 03/12/2018 at 12:33 PM.   Patient ID: Lawrence Higgins, male    DOB: 1933-08-23, 84 y.o.   MRN: [id number]  Chief Complaint  Patient presents with  . Hospitalization Follow-up   HPI Lawrence Higgins is a 84 y.o. male who presents to Primary Care at [facility] for hospital f/u. Admitted 9/16-19 for complicated UTI due to klebsiella, AKI due to bladder outlet obstruction. Initially had fever of 100.9 with dysuria, 4 days of IV rocephin and discharged on keflex 500 mg bid x 7 days. Bladder outlet obstruction with 900 mL PVR reported unremarkable renal US. H/o TURP reportedly ~30 yrs prior. Discharged on foley with ankle bag, Flomax 0.4 mg qd and urology f/u 10/3. - Pt took keflex every 6 hrs (total of 4 tabs) the first day he was discharged, but received a call on 9/20 switching the dosing to every 12 hrs (2 tabs/day). Denies fever, abdominal pain, nausea, vomiting.  AKI Felt to be post-obstructive. Creatinine decreased to 1.54 on day of discharge. Creatinine peaked at 2.67 on day of admission. Losartan-HCTZ was held.  DM Last seen 8/22. Continued on amaryl 4 mg bid, started on lantus in hospital at 7 units due to A1C of 10.1. Lowest glucose reading during hospitalization 161. - Pt is not currently taking any meds for DM. Reports morning blood glucose reading of 241 today, reportedly was averaging 140-150 prior to hospitalization.  Patient Active Problem List   Diagnosis Date Noted  . Hypertension 03/02/2018  . Diabetes mellitus (HCC) 03/02/2018  . UTI (urinary tract infection) 03/01/2018  . Acute renal injury (HCC) 03/01/2018   Past Medical History:  Diagnosis Date  . Diabetes mellitus without complication (HCC)   . Hypertension   . UTI (urinary tract infection) 02/2018   Past  Surgical History:  Procedure Laterality Date  . MULTIPLE TOOTH EXTRACTIONS     Allergies  Allergen Reactions  . Tylenol [Acetaminophen] Nausea And Vomiting  . Lisinopril Cough   Prior to Admission medications   Medication Sig Start Date End Date Taking? Authorizing Provider  cephALEXin (KEFLEX) 500 MG capsule Take 1 capsule (500 mg total) by mouth 2 (two) times daily for 10 days. 03/05/18 03/15/18 Yes Lanelle Bal, MD  glimepiride (AMARYL) 4 MG tablet Take 1 tablet (4 mg total) by mouth daily with breakfast. 01/05/18  Yes Shade Flood, MD  pantoprazole (PROTONIX) 20 MG tablet Take 20 mg by mouth daily as needed for heartburn or indigestion.  02/09/18  Yes [provider]  tamsulosin (FLOMAX) 0.4 MG CAPS capsule Take 1 capsule (0.4 mg total) by mouth daily. 03/05/18  Yes Lanelle Bal, MD   Social History   Socioeconomic History  . Marital status: Married    Spouse name: Not on file  . Number of children: Not on file  . Years of education: Not on file  . Highest education level: Not on file  Occupational History  . Not on file  Social Needs  . Financial resource strain: Not on file  . Food insecurity:    Worry: Not on file    Inability: Not on file  . Transportation needs:    Medical: Not on file    Non-medical: Not on file  Tobacco Use  . Smoking status: Former Smoker    Last attempt to quit:  02/04/1978    Years since quitting: 40.1  . Smokeless tobacco: Never Used  Substance and Sexual Activity  . Alcohol use: Not Currently  . Drug use: Never  . Sexual activity: Not on file  Lifestyle  . Physical activity:    Days per week: Not on file    Minutes per session: Not on file  . Stress: Not on file  Relationships  . Social connections:    Talks on phone: Not on file    Gets together: Not on file    Attends religious service: Not on file    Active member of club or organization: Not on file    Attends meetings of clubs or organizations: Not on  file    Relationship status: Not on file  . Intimate partner violence:    Fear of current or ex partner: Not on file    Emotionally abused: Not on file    Physically abused: Not on file    Forced sexual activity: Not on file  Other Topics Concern  . Not on file  Social History Narrative  . Not on file   Review of Systems  Constitutional: Negative for fatigue, fever and unexpected weight change.  Eyes: Negative for visual disturbance.  Respiratory: Negative for cough, chest tightness and shortness of breath.   Cardiovascular: Negative for chest pain, palpitations and leg swelling.  Gastrointestinal: Negative for abdominal pain, blood in stool, nausea and vomiting.  Neurological: Negative for dizziness, light-headedness and headaches.      Objective:   Physical Exam  Constitutional: He is oriented to person, place, and time. He appears well-developed and well-nourished. No distress.  HENT:  Head: Normocephalic and atraumatic.  Eyes: Pupils are equal, round, and reactive to light. Conjunctivae and EOM are normal.  Neck: Neck supple. No JVD present. Carotid bruit is not present. No tracheal deviation present.  Cardiovascular: Normal rate, regular rhythm and normal heart sounds.  No murmur heard. Pulmonary/Chest: Effort normal and breath sounds normal. No respiratory distress. He has no rales.  Abdominal: Soft. There is no tenderness. There is no CVA tenderness.  Musculoskeletal: Normal range of motion. He exhibits no edema.  Neurological: He is alert and oriented to person, place, and time.  Skin: Skin is warm and dry.  Psychiatric: He has a normal mood and affect. His behavior is normal.  Nursing note and vitals reviewed.  Vitals:   03/12/18 1157 03/12/18 1207  BP: (!) 149/77 122/70  Pulse: 72   Resp: 18   Temp: 98.4 F (36.9 C)   TempSrc: Oral   SpO2: 94%   Weight: 202 lb 6.4 oz (91.8 kg)   Height: 5\' 8"  (1.727 m)       Assessment & Plan:   Lawrence Higgins is a 84 y.o.  male AKI (acute kidney injury) (HCC) - Plan: Basic metabolic panel  -Thought to be postobstructive, improving at hospital, repeat BMP to measure stability.  Avoid NSAIDs and maintain hydration.  UTI due to Klebsiella species  -Improving.  With obstruction has continued Foley catheter with plan follow-up with urology.  RTC precautions if any new symptoms or signs or symptoms of recurrent urinary tract infection  Type 2 diabetes mellitus with hyperglycemia, without long-term current use of insulin (HCC) - Plan: Basic metabolic panel  -There was some misunderstanding regarding his home medicines.  Restart Amaryl, monitor home readings and recheck levels in the next 2 weeks  Essential hypertension - Plan: losartan-hydrochlorothiazide (HYZAAR) 50-12.5 MG tablet  -Mildly elevated, but likely  can restart meds at a lower dose.  Will decrease losartan component to 50 mg. However if any lightheadedness or dizziness, stop meds and return right away.  Meds ordered this encounter  Medications  . losartan-hydrochlorothiazide (HYZAAR) 50-12.5 MG tablet    Sig: Take 1 tablet by mouth daily.    Dispense:  90 tablet    Refill:  3   Patient Instructions   Follow-up with urology next week as planned.  If any new abdominal pain, fevers or chills, or you feel any new symptoms of illness to be seen right away.  Okay to stop cephalexin after tonight's dose.   I will recheck the kidney function test today.  Make sure to drink sufficient fluids.  Avoid NSAIDs such as Advil or Aleve.   Blood pressure was slightly elevated today, will restart blood pressure medication with a lower dose.  Watch for any lightheadedness or dizziness that may be due to overtreatment of high blood pressure.  If that occurs then stop losartan HCTZ and see me to determine change in meds.   Restart glimepiride twice per day.  Monitor blood sugars with reading once per day and bring a record of those readings at follow-up 2 weeks to  determine changes for diabetes treatment.   Return to the clinic or go to the nearest emergency room if any of your symptoms worsen or new symptoms occur.     If you have lab work done today you will be contacted with your lab results within the next 2 weeks.  If you have not heard from [place] then please contact us. The fastest way to get your results is to register for My Chart.   IF you received an x-ray today, you will receive an invoice from [facility]. Please contact [facility] at [phone] with questions or concerns regarding your invoice.   IF you received labwork today, you will receive an invoice from [place]. Please contact [facility] at [phone] with questions or concerns regarding your invoice.   Our billing staff will not be able to assist you with questions regarding bills from these companies.  You will be contacted with the lab results as soon as they are available. The fastest way to get your results is to activate your My Chart account. Instructions are located on the last page of this paperwork. If you have not heard from [place] regarding the results in 2 weeks, please contact this office.      I personally performed the services described in this documentation, which was scribed in my presence. The recorded information has been reviewed and considered for accuracy and completeness, addended by me as needed, and agree with information above.  Signed,   Meredith Staggers, MD Primary Care at [facility].  03/14/18 12:30 PM

## 2018-03-12 NOTE — Patient Instructions (Addendum)
Follow-up with urology next week as planned.  If any new abdominal pain, fevers or chills, or you feel any new symptoms of illness to be seen right away.  Okay to stop cephalexin after tonight's dose.   I will recheck the kidney function test today.  Make sure to drink sufficient fluids.  Avoid NSAIDs such as Advil or Aleve.   Blood pressure was slightly elevated today, will restart blood pressure medication with a lower dose.  Watch for any lightheadedness or dizziness that may be due to overtreatment of high blood pressure.  If that occurs then stop losartan HCTZ and see me to determine change in meds.   Restart glimepiride twice per day.  Monitor blood sugars with reading once per day and bring a record of those readings at follow-up 2 weeks to determine changes for diabetes treatment.   Return to the clinic or go to the nearest emergency room if any of your symptoms worsen or new symptoms occur.     If you have lab work done today you will be contacted with your lab results within the next 2 weeks.  If you have not heard from [place] then please contact us. The fastest way to get your results is to register for My Chart.   IF you received an x-ray today, you will receive an invoice from [facility]. Please contact [facility] at [phone] with questions or concerns regarding your invoice.   IF you received labwork today, you will receive an invoice from [place]. Please contact [facility] at [phone] with questions or concerns regarding your invoice.   Our billing staff will not be able to assist you with questions regarding bills from these companies.  You will be contacted with the lab results as soon as they are available. The fastest way to get your results is to activate your My Chart account. Instructions are located on the last page of this paperwork. If you have not heard from [place] regarding the results in 2 weeks, please contact this office.

## 2018-03-13 LAB — BASIC METABOLIC PANEL
BUN / CREAT RATIO: 12 (ref 10–24)
BUN: 16 mg/dL (ref 8–27)
CHLORIDE: 98 mmol/L (ref 96–106)
CO2: 23 mmol/L (ref 20–29)
Calcium: 9.3 mg/dL (ref 8.6–10.2)
Creatinine, Ser: 1.33 mg/dL — ABNORMAL HIGH (ref 0.76–1.27)
GFR calc non Af Amer: 50 mL/min/{1.73_m2} — ABNORMAL LOW (ref >59)
GFR, EST AFRICAN AMERICAN: 58 mL/min/{1.73_m2} — AB (ref >59)
Glucose: 236 mg/dL — ABNORMAL HIGH (ref 65–99)
Potassium: 4.9 mmol/L (ref 3.5–5.2)
Sodium: 137 mmol/L (ref 134–144)

## 2018-03-14 ENCOUNTER — Encounter: Payer: Self-pay | Admitting: Family Medicine

## 2018-03-24 ENCOUNTER — Ambulatory Visit: Payer: Medicare Other | Admitting: Family Medicine

## 2018-03-24 ENCOUNTER — Encounter: Payer: Self-pay | Admitting: Radiology

## 2018-03-27 ENCOUNTER — Telehealth: Payer: Self-pay | Admitting: Family Medicine

## 2018-03-27 ENCOUNTER — Ambulatory Visit: Payer: Medicare Other | Admitting: Family Medicine

## 2018-03-27 NOTE — Telephone Encounter (Signed)
Called to check status, as he had to leave prior to being seen.  Unable to leave VM on mobile, or home number. Has appt with me on 10/15.

## 2018-03-30 ENCOUNTER — Ambulatory Visit: Payer: Medicare Other | Admitting: Family Medicine

## 2018-03-30 ENCOUNTER — Encounter: Payer: Self-pay | Admitting: Family Medicine

## 2018-03-30 ENCOUNTER — Other Ambulatory Visit: Payer: Self-pay

## 2018-03-30 VITALS — BP 127/77 | HR 68 | Temp 97.9°F | Resp 16 | Ht 68.0 in | Wt 204.6 lb

## 2018-03-30 DIAGNOSIS — E1165 Type 2 diabetes mellitus with hyperglycemia: Secondary | ICD-10-CM | POA: Diagnosis not present

## 2018-03-30 DIAGNOSIS — I10 Essential (primary) hypertension: Secondary | ICD-10-CM

## 2018-03-30 MED ORDER — IRBESARTAN 150 MG PO TABS: 150.0000 mg | ORAL_TABLET | Freq: Every day | ORAL | 1 refills | Status: DC

## 2018-03-30 MED ORDER — METFORMIN HCL 500 MG PO TABS: 500.0000 mg | ORAL_TABLET | Freq: Every day | ORAL | 1 refills | Status: DC

## 2018-03-30 NOTE — Patient Instructions (Addendum)
   Stop the losartan HCT, and start irbesartan 1 pill once per day for blood pressure. Start metformin 1 pill once per day, continue glimepiride twice per day for diabetes.  Recheck in 2 months for repeat blood work.  If any changes in blood pressure or blood sugar control during that time, I am happy to see you sooner.   If you have lab work done today you will be contacted with your lab results within the next 2 weeks.  If you have not heard from [place] then please contact us. The fastest way to get your results is to register for My Chart.   IF you received an x-ray today, you will receive an invoice from [facility]. Please contact [facility] at [phone] with questions or concerns regarding your invoice.   IF you received labwork today, you will receive an invoice from [place]. Please contact [facility] at [phone] with questions or concerns regarding your invoice.   Our billing staff will not be able to assist you with questions regarding bills from these companies.  You will be contacted with the lab results as soon as they are available. The fastest way to get your results is to activate your My Chart account. Instructions are located on the last page of this paperwork. If you have not heard from [place] regarding the results in 2 weeks, please contact this office.

## 2018-03-30 NOTE — Progress Notes (Signed)
Subjective:    Patient ID: Lawrence Higgins, male    DOB: 1934/02/09, 84 y.o.   MRN: [id number]  HPI Lawrence Higgins is a 84 y.o. male Presents today for: Chief Complaint  Patient presents with  . Diabetes  . Medication Refill    losartan    Diabetes:  Lab Results  Component Value Date   HGBA1C 10.1 (H) 03/02/2018  Previously had been on Amaryl 4 mg twice daily, but elevated A1c in the hospital mid-September..  Started on Lantus.  Once he was discharged, had stopped medication for diabetes.  Glucose had ranged from 1 40-1 50 prior to his hospitalization in September, had a reading of 241 at home prior to last visit on September 27.  Restarted Amaryl at previous doses, here for recheck.   home readings have improved.  Since restating amaryl, readings 155-182. No symptomatic lows.    Hypertension: Has been using 1/2 pill of losartan hct.  BP Readings from Last 3 Encounters:  03/30/18 127/77  03/12/18 122/70  03/03/18 137/85   Lab Results  Component Value Date   CREATININE 1.33 (H) 03/12/2018  Previous acute kidney injury thought to be postobstructive, with Klebsiella UTI, AKI with bladder outlet obstruction.  Creatinine had decreased to 1.54 on his day of discharge, peaked at 2.67. urinating okay.  Drinking fluids sufficiently.     Patient Active Problem List   Diagnosis Date Noted  . Hypertension 03/02/2018  . Diabetes mellitus (HCC) 03/02/2018  . UTI (urinary tract infection) 03/01/2018  . Acute renal injury (HCC) 03/01/2018   Past Medical History:  Diagnosis Date  . Diabetes mellitus without complication (HCC)   . Hypertension   . UTI (urinary tract infection) 02/2018   Past Surgical History:  Procedure Laterality Date  . MULTIPLE TOOTH EXTRACTIONS     Allergies  Allergen Reactions  . Tylenol [Acetaminophen] Nausea And Vomiting  . Lisinopril Cough   Prior to Admission medications   Medication Sig Start Date End Date Taking? Authorizing Provider  glimepiride  (AMARYL) 4 MG tablet Take 1 tablet (4 mg total) by mouth daily with breakfast. 01/05/18  Yes Shade Flood, MD  losartan-hydrochlorothiazide (HYZAAR) 50-12.5 MG tablet Take 1 tablet by mouth daily. 03/12/18  Yes Shade Flood, MD  pantoprazole (PROTONIX) 20 MG tablet Take 20 mg by mouth daily as needed for heartburn or indigestion.  02/09/18  Yes [provider]  tamsulosin (FLOMAX) 0.4 MG CAPS capsule Take 1 capsule (0.4 mg total) by mouth daily. 03/05/18  Yes Lanelle Bal, MD   Social History   Socioeconomic History  . Marital status: Married    Spouse name: Not on file  . Number of children: Not on file  . Years of education: Not on file  . Highest education level: Not on file  Occupational History  . Not on file  Social Needs  . Financial resource strain: Not on file  . Food insecurity:    Worry: Not on file    Inability: Not on file  . Transportation needs:    Medical: Not on file    Non-medical: Not on file  Tobacco Use  . Smoking status: Former Smoker    Last attempt to quit: 02/04/1978    Years since quitting: 40.1  . Smokeless tobacco: Never Used  Substance and Sexual Activity  . Alcohol use: Not Currently  . Drug use: Never  . Sexual activity: Not on file  Lifestyle  . Physical activity:    Days per week: Not  on file    Minutes per session: Not on file  . Stress: Not on file  Relationships  . Social connections:    Talks on phone: Not on file    Gets together: Not on file    Attends religious service: Not on file    Active member of club or organization: Not on file    Attends meetings of clubs or organizations: Not on file    Relationship status: Not on file  . Intimate partner violence:    Fear of current or ex partner: Not on file    Emotionally abused: Not on file    Physically abused: Not on file    Forced sexual activity: Not on file  Other Topics Concern  . Not on file  Social History Narrative  . Not on file    Review of  Systems Per HPI    Objective:   Physical Exam  Constitutional: He is oriented to person, place, and time. He appears well-developed and well-nourished.  HENT:  Head: Normocephalic and atraumatic.  Eyes: Pupils are equal, round, and reactive to light. EOM are normal.  Neck: No JVD present. Carotid bruit is not present.  Cardiovascular: Normal rate, regular rhythm and normal heart sounds.  No murmur heard. Pulmonary/Chest: Effort normal and breath sounds normal. He has no rales.  Musculoskeletal: He exhibits no edema.  Neurological: He is alert and oriented to person, place, and time.  Skin: Skin is warm and dry.  Psychiatric: He has a normal mood and affect.  Vitals reviewed.  Vitals:   03/30/18 1245  BP: 127/77  Pulse: 68  Resp: 16  Temp: 97.9 F (36.6 C)  TempSrc: Oral  SpO2: 93%  Weight: 204 lb 9.6 oz (92.8 kg)  Height: 5\' 8"  (1.727 m)       Assessment & Plan:   [name] is a 84 y.o. male Type 2 diabetes mellitus with hyperglycemia, without long-term current use of insulin (HCC) - Plan: metFORMIN (GLUCOPHAGE) 500 MG tablet  -Improved, but additional control needed.  Add metformin, continue Amaryl same dose.  Hypoglycemia precautions.  Recheck 2 months  Essential hypertension - Plan: irbesartan (AVAPRO) 150 MG tablet  -Stable, but changed to irbesartan given recall of losartan.  Recheck 2 months.  Meds ordered this encounter  Medications  . metFORMIN (GLUCOPHAGE) 500 MG tablet    Sig: Take 1 tablet (500 mg total) by mouth daily with breakfast.    Dispense:  90 tablet    Refill:  1  . irbesartan (AVAPRO) 150 MG tablet    Sig: Take 1 tablet (150 mg total) by mouth daily.    Dispense:  90 tablet    Refill:  1   Patient Instructions     Stop the losartan HCT, and start irbesartan 1 pill once per day for blood pressure. Start metformin 1 pill once per day, continue glimepiride twice per day for diabetes.  Recheck in 2 months for repeat blood work.  If any  changes in blood pressure or blood sugar control during that time, I am happy to see you sooner.   If you have lab work done today you will be contacted with your lab results within the next 2 weeks.  If you have not heard from [place] then please contact us. The fastest way to get your results is to register for My Chart.   IF you received an x-ray today, you will receive an invoice from [facility]. Please contact [facility] at [phone]  with questions or concerns regarding your invoice.   IF you received labwork today, you will receive an invoice from [place]. Please contact [facility] at [phone] with questions or concerns regarding your invoice.   Our billing staff will not be able to assist you with questions regarding bills from these companies.  You will be contacted with the lab results as soon as they are available. The fastest way to get your results is to activate your My Chart account. Instructions are located on the last page of this paperwork. If you have not heard from [place] regarding the results in 2 weeks, please contact this office.      Signed,   Meredith Staggers, MD Primary Care at [facility].  04/03/18 10:52 AM

## 2018-05-06 ENCOUNTER — Ambulatory Visit: Payer: Medicare Other | Admitting: Family Medicine

## 2018-06-02 ENCOUNTER — Encounter: Payer: Self-pay | Admitting: Family Medicine

## 2018-06-02 ENCOUNTER — Ambulatory Visit (INDEPENDENT_AMBULATORY_CARE_PROVIDER_SITE_OTHER): Payer: Medicare Other | Admitting: Family Medicine

## 2018-06-02 ENCOUNTER — Other Ambulatory Visit: Payer: Self-pay

## 2018-06-02 VITALS — BP 136/80 | HR 84 | Temp 98.2°F | Resp 18 | Ht 68.0 in | Wt 208.6 lb

## 2018-06-02 DIAGNOSIS — R7989 Other specified abnormal findings of blood chemistry: Secondary | ICD-10-CM | POA: Diagnosis not present

## 2018-06-02 DIAGNOSIS — E1165 Type 2 diabetes mellitus with hyperglycemia: Secondary | ICD-10-CM

## 2018-06-02 DIAGNOSIS — I10 Essential (primary) hypertension: Secondary | ICD-10-CM | POA: Diagnosis not present

## 2018-06-02 DIAGNOSIS — Z13220 Encounter for screening for lipoid disorders: Secondary | ICD-10-CM

## 2018-06-02 MED ORDER — METFORMIN HCL 500 MG PO TABS: 500.0000 mg | ORAL_TABLET | Freq: Every day | ORAL | 1 refills | Status: DC

## 2018-06-02 MED ORDER — GLIMEPIRIDE 4 MG PO TABS: 4.0000 mg | ORAL_TABLET | Freq: Every day | ORAL | 1 refills | Status: DC

## 2018-06-02 MED ORDER — IRBESARTAN 150 MG PO TABS: 150.0000 mg | ORAL_TABLET | Freq: Every day | ORAL | 1 refills | Status: DC

## 2018-06-02 NOTE — Patient Instructions (Addendum)
  Schedule eye doctor appointment (once per year with diabetes).  No medication changes at this time, but I will check some labs to make sure to still safe for you to use the current medications.  Recheck with me in the next 1 month with home blood sugar readings and we can discuss any knee issues at that time.  Please see me sooner if any worsening symptoms   If you have lab work done today you will be contacted with your lab results within the next 2 weeks.  If you have not heard from us then please contact us. The fastest way to get your results is to register for My Chart.   IF you received an x-ray today, you will receive an invoice from [facility]. Please contact [facility] at [phone] with questions or concerns regarding your invoice.   IF you received labwork today, you will receive an invoice from [facility]. Please contact [facility] at [phone] with questions or concerns regarding your invoice.   Our billing staff will not be able to assist you with questions regarding bills from these companies.  You will be contacted with the lab results as soon as they are available. The fastest way to get your results is to activate your My Chart account. Instructions are located on the last page of this paperwork. If you have not heard from us regarding the results in 2 weeks, please contact this office.

## 2018-06-02 NOTE — Progress Notes (Signed)
Subjective:    Patient ID: Lawrence Higgins, male    DOB: 1933/12/21, 84 y.o.   MRN: [id number]  HPI Lawrence Higgins is a 84 y.o. male Presents today for: Chief Complaint  Patient presents with  . Diabetes    follow up  . Hypertension   Diabetes: Metformin 500mg  QD (added in October) amaryl 4mg  qd (has always used QD). Home readings 140-160. No symptomatic lows. No missed doses.  On ARB, not statin. No recent lipid panel. Ate about 3hrs ago.  lantus 7 units in hospital d/t A1c readings. Not on insulin now.   Lab Results  Component Value Date   HGBA1C 10.1 (H) 03/02/2018   Lab Results  Component Value Date   CREATININE 1.33 (H) 03/12/2018  Due for optho exam, microalbumin, other DM HM up to date  Elevated creatinine: AKI  Earlier in year - improving creat last visit.  Drinking fluids.  No NSAIDS.   Some difficulty standing up from position of squatting on knees, but this has been going on for years.  Plans to discuss this at next visit  Hypertension: BP Readings from Last 3 Encounters:  06/02/18 136/80  03/30/18 127/77  03/12/18 122/70   Lab Results  Component Value Date   CREATININE 1.33 (H) 03/12/2018  avapro 150mg  qd. No new side effects.   Patient Active Problem List   Diagnosis Date Noted  . Hypertension 03/02/2018  . Diabetes mellitus (HCC) 03/02/2018  . UTI (urinary tract infection) 03/01/2018  . Acute renal injury (HCC) 03/01/2018   Past Medical History:  Diagnosis Date  . Diabetes mellitus without complication (HCC)   . Hypertension   . UTI (urinary tract infection) 02/2018   Past Surgical History:  Procedure Laterality Date  . MULTIPLE TOOTH EXTRACTIONS     Allergies  Allergen Reactions  . Tylenol [Acetaminophen] Nausea And Vomiting  . Lisinopril Cough   Prior to Admission medications   Medication Sig Start Date End Date Taking? Authorizing Provider  glimepiride (AMARYL) 4 MG tablet Take 1 tablet (4 mg total) by mouth daily with breakfast.  01/05/18  Yes Shade Flood, MD  irbesartan (AVAPRO) 150 MG tablet Take 1 tablet (150 mg total) by mouth daily. 03/30/18  Yes Shade Flood, MD  metFORMIN (GLUCOPHAGE) 500 MG tablet Take 1 tablet (500 mg total) by mouth daily with breakfast. 03/30/18  Yes Shade Flood, MD  pantoprazole (PROTONIX) 20 MG tablet Take 20 mg by mouth daily as needed for heartburn or indigestion.  02/09/18  Yes [provider]  tamsulosin (FLOMAX) 0.4 MG CAPS capsule Take 1 capsule (0.4 mg total) by mouth daily. 03/05/18  Yes Lanelle Bal, MD   Social History   Socioeconomic History  . Marital status: Married    Spouse name: Not on file  . Number of children: Not on file  . Years of education: Not on file  . Highest education level: Not on file  Occupational History  . Not on file  Social Needs  . Financial resource strain: Not on file  . Food insecurity:    Worry: Not on file    Inability: Not on file  . Transportation needs:    Medical: Not on file    Non-medical: Not on file  Tobacco Use  . Smoking status: Former Smoker    Last attempt to quit: 02/04/1978    Years since quitting: 40.3  . Smokeless tobacco: Never Used  Substance and Sexual Activity  . Alcohol use: Not Currently  .  Drug use: Never  . Sexual activity: Not on file  Lifestyle  . Physical activity:    Days per week: Not on file    Minutes per session: Not on file  . Stress: Not on file  Relationships  . Social connections:    Talks on phone: Not on file    Gets together: Not on file    Attends religious service: Not on file    Active member of club or organization: Not on file    Attends meetings of clubs or organizations: Not on file    Relationship status: Not on file  . Intimate partner violence:    Fear of current or ex partner: Not on file    Emotionally abused: Not on file    Physically abused: Not on file    Forced sexual activity: Not on file  Other Topics Concern  . Not on file  Social  History Narrative  . Not on file    Review of Systems     Objective:   Physical Exam Vitals signs reviewed.  Constitutional:      Appearance: He is well-developed.  HENT:     Head: Normocephalic and atraumatic.  Eyes:     Pupils: Pupils are equal, round, and reactive to light.  Neck:     Vascular: No carotid bruit or JVD.  Cardiovascular:     Rate and Rhythm: Normal rate and regular rhythm.     Heart sounds: Normal heart sounds. No murmur.  Pulmonary:     Effort: Pulmonary effort is normal.     Breath sounds: Normal breath sounds. No rales.  Skin:    General: Skin is warm and dry.  Neurological:     Mental Status: He is alert and oriented to person, place, and time.    Vitals:   06/02/18 1332 06/02/18 1406  BP: (!) 144/77 136/80  Pulse: 84   Resp: 18   Temp: 98.2 F (36.8 C)   SpO2: 95%       Assessment & Plan:  Lawrence Higgins is a 84 y.o. male Type 2 diabetes mellitus with hyperglycemia, without long-term current use of insulin (HCC)  - check A1c,contineu metformin low dose for now, but recheck GFR to make sure ok to continue.   -Continue Amaryl same dose for now.  Consider med changes once labs return.  Screening for hyperlipidemia  -Check lipids, CMP.  Elevated serum creatinine  -Repeat creatinine as above.  Avoid NSAIDs, maintain hydration.  Continue ARB for now.  Hypertension  -Stable, no change in irbesartan at this time.  Recheck 3 months  No orders of the defined types were placed in this encounter.  Patient Instructions    Schedule eye doctor appointment (once per year with diabetes).      If you have lab work done today you will be contacted with your lab results within the next 2 weeks.  If you have not heard from [place] then please contact us. The fastest way to get your results is to register for My Chart.   IF you received an x-ray today, you will receive an invoice from [facility]. Please contact [facility] at  [phone] with questions or concerns regarding your invoice.   IF you received labwork today, you will receive an invoice from [facility]. Please contact [facility] at [phone] with questions or concerns regarding your invoice.   Our billing staff will not be able to assist you with questions regarding bills from these companies.  You will  be contacted with the lab results as soon as they are available. The fastest way to get your results is to activate your My Chart account. Instructions are located on the last page of this paperwork. If you have not heard from us regarding the results in 2 weeks, please contact this office.      Signed,   Meredith StaggersJeffrey Letica Giaimo, MD Primary Care at [facility].  06/02/18 2:29 PM

## 2018-06-03 LAB — COMPREHENSIVE METABOLIC PANEL
A/G RATIO: 1.2 (ref 1.2–2.2)
ALT: 10 IU/L (ref 0–44)
AST: 8 IU/L (ref 0–40)
Albumin: 3.8 g/dL (ref 3.5–4.7)
Alkaline Phosphatase: 68 IU/L (ref 39–117)
BILIRUBIN TOTAL: 0.9 mg/dL (ref 0.0–1.2)
BUN/Creatinine Ratio: 10 (ref 10–24)
BUN: 15 mg/dL (ref 8–27)
CHLORIDE: 102 mmol/L (ref 96–106)
CO2: 24 mmol/L (ref 20–29)
Calcium: 9.3 mg/dL (ref 8.6–10.2)
Creatinine, Ser: 1.47 mg/dL — ABNORMAL HIGH (ref 0.76–1.27)
GFR calc Af Amer: 51 mL/min/{1.73_m2} — ABNORMAL LOW (ref >59)
GFR calc non Af Amer: 44 mL/min/{1.73_m2} — ABNORMAL LOW (ref >59)
Globulin, Total: 3.3 g/dL (ref 1.5–4.5)
Glucose: 227 mg/dL — ABNORMAL HIGH (ref 65–99)
POTASSIUM: 4 mmol/L (ref 3.5–5.2)
Sodium: 140 mmol/L (ref 134–144)
Total Protein: 7.1 g/dL (ref 6.0–8.5)

## 2018-06-03 LAB — MICROALBUMIN / CREATININE URINE RATIO
CREATININE, UR: 149.7 mg/dL
MICROALB/CREAT RATIO: 7.1 mg/g{creat} (ref 0.0–30.0)
MICROALBUM., U, RANDOM: 10.6 ug/mL

## 2018-06-03 LAB — LIPID PANEL
Chol/HDL Ratio: 4.2 ratio (ref 0.0–5.0)
Cholesterol, Total: 190 mg/dL (ref 100–199)
HDL: 45 mg/dL (ref >39)
LDL Calculated: 127 mg/dL — ABNORMAL HIGH (ref 0–99)
TRIGLYCERIDES: 91 mg/dL (ref 0–149)
VLDL Cholesterol Cal: 18 mg/dL (ref 5–40)

## 2018-06-03 LAB — HEMOGLOBIN A1C
Est. average glucose Bld gHb Est-mCnc: 217 mg/dL
HEMOGLOBIN A1C: 9.2 % — AB (ref 4.8–5.6)

## 2018-06-24 NOTE — Progress Notes (Signed)
Letter mailed out.

## 2018-07-02 ENCOUNTER — Ambulatory Visit (INDEPENDENT_AMBULATORY_CARE_PROVIDER_SITE_OTHER): Payer: Medicare Other | Admitting: Family Medicine

## 2018-07-02 ENCOUNTER — Encounter: Payer: Self-pay | Admitting: Family Medicine

## 2018-07-02 ENCOUNTER — Other Ambulatory Visit: Payer: Self-pay

## 2018-07-02 VITALS — BP 146/74 | HR 87 | Temp 98.0°F | Ht 68.0 in | Wt 205.0 lb

## 2018-07-02 DIAGNOSIS — E1165 Type 2 diabetes mellitus with hyperglycemia: Secondary | ICD-10-CM | POA: Diagnosis not present

## 2018-07-02 DIAGNOSIS — N189 Chronic kidney disease, unspecified: Secondary | ICD-10-CM | POA: Diagnosis not present

## 2018-07-02 DIAGNOSIS — E785 Hyperlipidemia, unspecified: Secondary | ICD-10-CM | POA: Diagnosis not present

## 2018-07-02 DIAGNOSIS — M25369 Other instability, unspecified knee: Secondary | ICD-10-CM

## 2018-07-02 MED ORDER — ATORVASTATIN CALCIUM 10 MG PO TABS: 5.0000 mg | ORAL_TABLET | Freq: Every day | ORAL | 1 refills | Status: DC

## 2018-07-02 NOTE — Patient Instructions (Addendum)
   The knee issues are likely related to arthritis.  As long as it does not cause you pain or acute changes we do not need  If you do need some devices at home to help with getting him out of the bathtub let me know and I can send in a prescription.  Another option is physical therapy so let me know if you would like a referral.   Blood sugar test was elevated, but we need to make sure what you are truly taking.  Please bring your medications with you and a list of home readings once per day.  That can be fasting or 2 hours after meals. Bring this by in next 2 weeks.   I will start the cholesterol medicine, low-dose initially.  If you have any new side effects with that medicine, stop it and let me know.  If you have lab work done today you will be contacted with your lab results within the next 2 weeks.  If you have not heard from [place] then please contact us. The fastest way to get your results is to register for My Chart.   IF you received an x-ray today, you will receive an invoice from [facility]. Please contact [facility] at [phone] with questions or concerns regarding your invoice.   IF you received labwork today, you will receive an invoice from [place]. Please contact [facility] at [phone] with questions or concerns regarding your invoice.   Our billing staff will not be able to assist you with questions regarding bills from these companies.  You will be contacted with the lab results as soon as they are available. The fastest way to get your results is to activate your My Chart account. Instructions are located on the last page of this paperwork. If you have not heard from [place] regarding the results in 2 weeks, please contact this office.

## 2018-07-02 NOTE — Progress Notes (Addendum)
Subjective:    Patient ID: Lawrence Higgins, male    DOB: [birth year], 85 y.o.   MRN: [id number]  Chief Complaint  Patient presents with  . knees buckle    has to hold on to something when he gets up   . lab results    Vit K    HPI Lawrence Higgins is a 85 y.o. male who presents to Primary Care at [facility] complaining of knees buckling.   Last seen on 07/03/2018 for his DM.  Pt's knees do not have arthralgia, but able to walk without difficulty.  Pt experiences difficult standing up from sitting or kneeling on floor only, or getting out of the bath tub.  Pt states that he is able to walk ok. No locking/giving way with usual activity. No swelling.  Longstanding symptoms.   Type 2 diabetes mellitus with hyperglycemia, without long-term current use of insulin (HCC)  Lab Results  Component Value Date   HGBA1C 9.2 (H) 06/02/2018   HGBA1C 10.1 (H) 03/02/2018   Lab Results  Component Value Date   LDLCALC 127 (H) 06/02/2018   CREATININE 1.47 (H) 06/02/2018   [id number]   Last A1C of 9.2 at the end of 05/2018 Gave the option to increase metformin to twice per day initially Associated with chronic kidney disease In the hospital, he received insulin,  Pt's wife states that his diet could be improved.  Pt is taking either amaryl or metformin, but he is only taking one pill per day- unsure of which med.  Morning of 07/02/2018 blood sugar reading of 138.  Pt states blood sugar is generally between 150-160 two hours after meals.    HLD: Lab Results  Component Value Date   CHOL 190 06/02/2018   HDL 45 06/02/2018   LDLCALC 127 (H) 06/02/2018   TRIG 91 06/02/2018   CHOLHDL 4.2 06/02/2018   Lab Results  Component Value Date   ALT 10 06/02/2018   AST 8 06/02/2018   ALKPHOS 68 06/02/2018   BILITOT 0.9 06/02/2018      Review of Systems  Cardiovascular: Negative for chest pain.  Neurological: Negative for light-headedness.  Per HPI     Objective:   Physical Exam Vitals signs reviewed.   Constitutional:      Appearance: He is well-developed.  HENT:     Head: Normocephalic and atraumatic.  Eyes:     Pupils: Pupils are equal, round, and reactive to light.  Neck:     Vascular: Carotid bruit present. No JVD.  Cardiovascular:     Rate and Rhythm: Normal rate. Rhythm irregular.     Heart sounds: Normal heart sounds. No murmur.     Comments: Few ectopic beats Pulmonary:     Effort: Pulmonary effort is normal.     Breath sounds: Normal breath sounds. No rales.  Musculoskeletal:     Comments: No pain with palpitation or range of motion. Equal strength intact in his lower extremities  Skin:    General: Skin is warm and dry.  Neurological:     Mental Status: He is alert and oriented to person, place, and time.       Assessment & Plan:   Lawrence Higgins is a 85 y.o. male Type 2 diabetes mellitus with hyperglycemia, without long-term current use of insulin (HCC)  -Exact regimen unknown.  Advised to bring medication with him to the office as well as home readings.  Can make changes to either glimepiride or metformin once I know what he is taking  and how.  Chronic kidney disease, unspecified CKD stage  -Avoid NSAIDs, maintain hydration.  Will need to watch creatinine with use of metformin  Hyperlipidemia, unspecified hyperlipidemia type - Plan: atorvastatin (LIPITOR) 10 MG tablet  -With history of diabetes will start low-dose statin.  Can try Lipitor 5 mg daily initially.  Instability of knee joint, unspecified laterality  -Noted with getting in and out of bathtub or with from squatting position but longstanding symptoms, denies recent changes.  Suspected arthritis.  Option of physical therapy discussed, and can evaluate for other assistive devices at home if needed.  Declined physical therapy this time.  RTC precautions if more persistent.  Declined further evaluation at this time  Meds ordered this encounter  Medications  . atorvastatin (LIPITOR) 10 MG tablet    Sig: Take  0.5 tablets (5 mg total) by mouth daily.    Dispense:  45 tablet    Refill:  1   Patient Instructions     The knee issues are likely related to arthritis.  As long as it does not cause you pain or acute changes we do not need  If you do need some devices at home to help with getting him out of the bathtub let me know and I can send in a prescription.  Another option is physical therapy so let me know if you would like a referral.   Blood sugar test was elevated, but we need to make sure what you are truly taking.  Please bring your medications with you and a list of home readings once per day.  That can be fasting or 2 hours after meals. Bring this by in next 2 weeks.   I will start the cholesterol medicine, low-dose initially.  If you have any new side effects with that medicine, stop it and let me know.  If you have lab work done today you will be contacted with your lab results within the next 2 weeks.  If you have not heard from [place] then please contact us. The fastest way to get your results is to register for My Chart.   IF you received an x-ray today, you will receive an invoice from [facility]. Please contact [facility] at [phone] with questions or concerns regarding your invoice.   IF you received labwork today, you will receive an invoice from [place]. Please contact [facility] at [phone] with questions or concerns regarding your invoice.   Our billing staff will not be able to assist you with questions regarding bills from these companies.  You will be contacted with the lab results as soon as they are available. The fastest way to get your results is to activate your My Chart account. Instructions are located on the last page of this paperwork. If you have not heard from [place] regarding the results in 2 weeks, please contact this office.         I, [employer], acting as a Neurosurgeon for Shade Flood, MD, have documented all relevant documentation  on the behalf of Shade Flood, MD, as directed by Shade Flood, MD while in the presence of Shade Flood, MD.  I personally performed the services described in this documentation, which was scribed in my presence. The recorded information has been reviewed and considered for accuracy and completeness, addended by me as needed, and agree with information above.  Signed,   Meredith Staggers, MD Primary Care at [facility].  07/04/18 7:42 PM

## 2018-07-04 ENCOUNTER — Encounter: Payer: Self-pay | Admitting: Family Medicine

## 2018-08-19 ENCOUNTER — Telehealth: Payer: Self-pay | Admitting: Family Medicine

## 2018-08-19 NOTE — Telephone Encounter (Signed)
Pt dropped off diabetes report for Dr.Greene , put in providers box FR

## 2018-08-20 NOTE — Telephone Encounter (Signed)
fyi

## 2018-08-26 ENCOUNTER — Telehealth: Payer: Self-pay | Admitting: Family Medicine

## 2018-08-26 NOTE — Telephone Encounter (Signed)
Home readings reviewed from February 20th through March 3.  Low of 109, high of 219.  Overall appears to be 130s to 140 range, most recently 120, 145, 111.

## 2018-09-22 ENCOUNTER — Other Ambulatory Visit: Payer: Self-pay | Admitting: Family Medicine

## 2018-09-22 DIAGNOSIS — E1165 Type 2 diabetes mellitus with hyperglycemia: Secondary | ICD-10-CM

## 2018-12-25 ENCOUNTER — Other Ambulatory Visit: Payer: Self-pay | Admitting: Family Medicine

## 2018-12-25 DIAGNOSIS — I10 Essential (primary) hypertension: Secondary | ICD-10-CM

## 2018-12-26 NOTE — Telephone Encounter (Signed)
Requested Prescriptions  Pending Prescriptions Disp Refills  . irbesartan (AVAPRO) 150 MG tablet [Pharmacy Med Name: Irbesartan 150 MG Oral Tablet] 90 tablet 0    Sig: Take 1 tablet by mouth once daily     Cardiovascular:  Angiotensin Receptor Blockers Failed - 12/25/2018  2:43 PM      Failed - Cr in normal range and within 180 days    Creatinine, Ser  Date Value Ref Range Status  06/02/2018 1.47 (H) 0.76 - 1.27 mg/dL Final         Failed - K in normal range and within 180 days    Potassium  Date Value Ref Range Status  06/02/2018 4.0 3.5 - 5.2 mmol/L Final         Failed - Last BP in normal range    BP Readings from Last 1 Encounters:  07/02/18 (!) 146/74         Passed - Patient is not pregnant      Passed - Valid encounter within last 6 months    Recent Outpatient Visits          5 months ago Type 2 diabetes mellitus with hyperglycemia, without long-term current use of insulin ([place])   [facility] at [facility], Ranell Patrick, MD   6 months ago Type 2 diabetes mellitus with hyperglycemia, without long-term current use of insulin [facility])   [facility] at [facility], Ranell Patrick, MD   9 months ago Type 2 diabetes mellitus with hyperglycemia, without long-term current use of insulin [facility])   [facility] at [facility], Ranell Patrick, MD   9 months ago AKI (acute kidney injury) [facility])   [facility] at [facility], Ranell Patrick, MD   10 months ago Type 2 diabetes mellitus without complication, with long-term current use of insulin [facility])   [facility] Care at [facility], Ranell Patrick, MD

## 2019-01-31 ENCOUNTER — Other Ambulatory Visit: Payer: Self-pay | Admitting: Family Medicine

## 2019-01-31 DIAGNOSIS — E785 Hyperlipidemia, unspecified: Secondary | ICD-10-CM

## 2019-03-24 ENCOUNTER — Other Ambulatory Visit: Payer: Self-pay | Admitting: Family Medicine

## 2019-03-24 DIAGNOSIS — I10 Essential (primary) hypertension: Secondary | ICD-10-CM

## 2019-03-24 NOTE — Telephone Encounter (Signed)
Requested medication (s) are due for refill today: yes  Requested medication (s) are on the active medication list: yes  Last refill:  12/25/2018  Future visit scheduled: no  Notes to clinic:  Review for refill Overdue for office visit   Requested Prescriptions  Pending Prescriptions Disp Refills   glimepiride (AMARYL) 4 MG tablet [Pharmacy Med Name: Glimepiride 4 MG Oral Tablet] 90 tablet 0    Sig: Take 1 tablet by mouth once daily with breakfast     Endocrinology:  Diabetes - Sulfonylureas Failed - 03/24/2019  2:47 PM      Failed - HBA1C is between 0 and 7.9 and within 180 days    Hgb A1c MFr Bld  Date Value Ref Range Status  06/02/2018 9.2 (H) 4.8 - 5.6 % Final    Comment:             Prediabetes: 5.7 - 6.4          Diabetes: >6.4          Glycemic control for adults with diabetes: <7.0          Failed - Valid encounter within last 6 months    Recent Outpatient Visits          8 months ago Type 2 diabetes mellitus with hyperglycemia, without long-term current use of insulin (HCC)   [facility] at [facility], Asencion Partridge, MD   9 months ago Type 2 diabetes mellitus with hyperglycemia, without long-term current use of insulin (HCC)   [facility] at [facility], Asencion Partridge, MD   11 months ago Type 2 diabetes mellitus with hyperglycemia, without long-term current use of insulin [facility])   [facility] at [facility], Asencion Partridge, MD   1 year ago AKI (acute kidney injury) [facility])   [facility] at [facility], Asencion Partridge, MD   1 year ago Type 2 diabetes mellitus without complication, with long-term current use of insulin (HCC)   [facility] at [facility], Asencion Partridge, MD              irbesartan (AVAPRO) 150 MG tablet [Pharmacy Med Name: Irbesartan 150 MG Oral Tablet] 90 tablet 0    Sig: Take 1 tablet by mouth once daily     Cardiovascular:  Angiotensin Receptor Blockers Failed - 03/24/2019  2:47 PM      Failed - Cr in normal range and within 180 days    Creatinine,  Ser  Date Value Ref Range Status  06/02/2018 1.47 (H) 0.76 - 1.27 mg/dL Final         Failed - K in normal range and within 180 days    Potassium  Date Value Ref Range Status  06/02/2018 4.0 3.5 - 5.2 mmol/L Final         Failed - Last BP in normal range    BP Readings from Last 1 Encounters:  07/02/18 (!) 146/74         Failed - Valid encounter within last 6 months    Recent Outpatient Visits          8 months ago Type 2 diabetes mellitus with hyperglycemia, without long-term current use of insulin [facility])   [facility], Asencion Partridge, MD   9 months ago Type 2 diabetes mellitus with hyperglycemia, without long-term current use of insulin [facility])   [facility] at [facility], Asencion Partridge, MD   11 months ago Type 2 diabetes mellitus with hyperglycemia, without long-term current use  of insulin [facility])   [facility], Ranell Patrick, MD   1 year ago AKI (acute kidney injury) [facility])   [facility] at [facility], Ranell Patrick, MD   1 year ago Type 2 diabetes mellitus without complication, with long-term current use of insulin [facility])   [facility] at [facility], Ranell Patrick, MD             Passed - Patient is not pregnant

## 2019-03-25 NOTE — Telephone Encounter (Signed)
Courtesy refill has been sent for 30 day no further refills without office  

## 2019-03-25 NOTE — Telephone Encounter (Signed)
Called and left message with wife to call office back

## 2019-03-25 NOTE — Telephone Encounter (Signed)
Courtesy refill has been sent for 30 day no further refills without office visit. Please schedule appt

## 2019-03-25 NOTE — Telephone Encounter (Signed)
No further refills without office visit 

## 2019-04-27 ENCOUNTER — Other Ambulatory Visit: Payer: Self-pay | Admitting: Family Medicine

## 2019-04-27 DIAGNOSIS — I10 Essential (primary) hypertension: Secondary | ICD-10-CM

## 2019-04-27 DIAGNOSIS — E785 Hyperlipidemia, unspecified: Secondary | ICD-10-CM

## 2019-04-27 NOTE — Telephone Encounter (Signed)
Requested medication (s) are due for refill today: yes  Requested medication (s) are on the active medication list: yes  Last refill:  01/31/2019  Future visit scheduled: yes  Notes to clinic: Patient has appointment in 2 days   Requested Prescriptions  Pending Prescriptions Disp Refills   atorvastatin (LIPITOR) 10 MG tablet [Pharmacy Med Name: Atorvastatin Calcium 10 MG Oral Tablet] 45 tablet 0    Sig: Take 1/2 (one-half) tablet by mouth once daily     Cardiovascular:  Antilipid - Statins Failed - 04/27/2019 10:00 AM      Failed - LDL in normal range and within 360 days    LDL Calculated  Date Value Ref Range Status  06/02/2018 127 (H) 0 - 99 mg/dL Final         Passed - Total Cholesterol in normal range and within 360 days    Cholesterol, Total  Date Value Ref Range Status  06/02/2018 190 100 - 199 mg/dL Final         Passed - HDL in normal range and within 360 days    HDL  Date Value Ref Range Status  06/02/2018 45 >39 mg/dL Final         Passed - Triglycerides in normal range and within 360 days    Triglycerides  Date Value Ref Range Status  06/02/2018 91 0 - 149 mg/dL Final         Passed - Patient is not pregnant      Passed - Valid encounter within last 12 months    Recent Outpatient Visits          9 months ago Type 2 diabetes mellitus with hyperglycemia, without long-term current use of insulin (HCC)   [facility] [place], Asencion Partridge, MD   10 months ago Type 2 diabetes mellitus with hyperglycemia, without long-term current use of insulin [facility])   [facility] [place], Asencion Partridge, MD   1 year ago Type 2 diabetes mellitus with hyperglycemia, without long-term current use of insulin [facility])   [facility] [place], Asencion Partridge, MD   1 year ago AKI (acute kidney injury) [facility])   [facility] [place], Asencion Partridge, MD   1 year ago Type 2 diabetes mellitus without complication, with long-term current use of insulin [facility])   [facility] [place], Asencion Partridge, MD      Future Appointments            In 2 days Shade Flood, MD [facility] [place], [facility]            glimepiride (AMARYL) 4 MG tablet [Pharmacy Med Name: Glimepiride 4 MG Oral Tablet] 30 tablet 0    Sig: Take 1 tablet by mouth once daily with breakfast     Endocrinology:  Diabetes - Sulfonylureas Failed - 04/27/2019 10:00 AM      Failed - HBA1C is between 0 and 7.9 and within 180 days    Hgb A1c MFr Bld  Date Value Ref Range Status  06/02/2018 9.2 (H) 4.8 - 5.6 % Final    Comment:             Prediabetes: 5.7 - 6.4          Diabetes: >6.4          Glycemic control for adults with diabetes: <7.0          Failed - Valid encounter within last 6 months  Recent Outpatient Visits          9 months ago Type 2 diabetes mellitus with hyperglycemia, without long-term current use of insulin [facility])   [facility] [place], Ranell Patrick, MD   10 months ago Type 2 diabetes mellitus with hyperglycemia, without long-term current use of insulin [facility])   [facility] [place], Ranell Patrick, MD   1 year ago Type 2 diabetes mellitus with hyperglycemia, without long-term current use of insulin [facility])   [facility] [place], Ranell Patrick, MD   1 year ago AKI (acute kidney injury) [facility])   [facility] [place], Ranell Patrick, MD   1 year ago Type 2 diabetes mellitus without complication, with long-term current use of insulin [facility])   [facility] [place], Ranell Patrick, MD      Future Appointments            In 2 days Wendie Agreste, MD [facility] [place], [facility]            irbesartan (AVAPRO) 150 MG tablet [Pharmacy Med Name: Irbesartan 150 MG Oral Tablet] 30 tablet 0    Sig: Take 1 tablet by mouth once daily     Cardiovascular:  Angiotensin Receptor Blockers Failed - 04/27/2019 10:00 AM      Failed - Cr in normal range and within 180 days    Creatinine, Ser  Date Value Ref Range Status  06/02/2018 1.47 (H) 0.76  - 1.27 mg/dL Final         Failed - K in normal range and within 180 days    Potassium  Date Value Ref Range Status  06/02/2018 4.0 3.5 - 5.2 mmol/L Final         Failed - Last BP in normal range    BP Readings from Last 1 Encounters:  07/02/18 (!) 146/74         Failed - Valid encounter within last 6 months    Recent Outpatient Visits          9 months ago Type 2 diabetes mellitus with hyperglycemia, without long-term current use of insulin ([place])   [facility] [place] R, MD   10 months ago Type 2 diabetes mellitus with hyperglycemia, without long-term current use of insulin [facility])   [facility] [place], Ranell Patrick, MD   1 year ago Type 2 diabetes mellitus with hyperglycemia, without long-term current use of insulin [facility])   [facility] [place], Ranell Patrick, MD   1 year ago AKI (acute kidney injury) [facility])   [facility] [place], Ranell Patrick, MD   1 year ago Type 2 diabetes mellitus without complication, with long-term current use of insulin [facility])   [facility] [place], Ranell Patrick, MD      Future Appointments            In 2 days Wendie Agreste, MD [facility] at [place], [place] - Patient is not pregnant

## 2019-04-29 ENCOUNTER — Ambulatory Visit (INDEPENDENT_AMBULATORY_CARE_PROVIDER_SITE_OTHER): Payer: Medicare Other | Admitting: Family Medicine

## 2019-04-29 ENCOUNTER — Encounter: Payer: Self-pay | Admitting: Family Medicine

## 2019-04-29 ENCOUNTER — Other Ambulatory Visit: Payer: Self-pay

## 2019-04-29 VITALS — BP 132/70 | HR 78 | Temp 97.5°F | Ht 67.0 in | Wt 210.4 lb

## 2019-04-29 DIAGNOSIS — N189 Chronic kidney disease, unspecified: Secondary | ICD-10-CM

## 2019-04-29 DIAGNOSIS — Z23 Encounter for immunization: Secondary | ICD-10-CM

## 2019-04-29 DIAGNOSIS — E785 Hyperlipidemia, unspecified: Secondary | ICD-10-CM | POA: Diagnosis not present

## 2019-04-29 DIAGNOSIS — E1165 Type 2 diabetes mellitus with hyperglycemia: Secondary | ICD-10-CM

## 2019-04-29 DIAGNOSIS — I10 Essential (primary) hypertension: Secondary | ICD-10-CM | POA: Diagnosis not present

## 2019-04-29 LAB — POCT GLYCOSYLATED HEMOGLOBIN (HGB A1C): Hemoglobin A1C: 7.4 % — AB (ref 4.0–5.6)

## 2019-04-29 LAB — GLUCOSE, POCT (MANUAL RESULT ENTRY)
POC Glucose: 45 mg/dl — AB (ref 70–99)
POC Glucose: 106 mg/dl — AB (ref 70–99)

## 2019-04-29 MED ORDER — METFORMIN HCL 500 MG PO TABS: 500.0000 mg | ORAL_TABLET | Freq: Two times a day (BID) | ORAL | 0 refills | Status: DC

## 2019-04-29 NOTE — Patient Instructions (Addendum)
   Restart blood pressure medication.  If home readings are over 140/90 once you are back on medications, follow-up to discuss further.   Schedule appointment with your eye care provider for diabetic eye exam when possible. No change in cholesterol medication today. Stop glimepiride as that can cause your blood sugars to run too low like today.   Increase metformin to 500 mg twice per day.  We will need to keep a close eye on your kidney function with use of Metformin.  Recheck in 2 weeks with telemed visit. If you have lab work done today you will be contacted with your lab results within the next 2 weeks.  If you have not heard from [place] then please contact us. The fastest way to get your results is to register for My Chart.   IF you received an x-ray today, you will receive an invoice from [facility]. Please contact [facility] at [phone] with questions or concerns regarding your invoice.   IF you received labwork today, you will receive an invoice from [facility]. Please contact [facility] at [phone] with questions or concerns regarding your invoice.   Our billing staff will not be able to assist you with questions regarding bills from these companies.  You will be contacted with the lab results as soon as they are available. The fastest way to get your results is to activate your My Chart account. Instructions are located on the last page of this paperwork. If you have not heard from [place] regarding the results in 2 weeks, please contact this office.

## 2019-04-29 NOTE — Progress Notes (Signed)
Subjective:  Patient ID: Lawrence Higgins, male    DOB: 11-Nov-1933  Age: 85 y.o. MRN: [id number]  CC:  Chief Complaint  Patient presents with  . Hypertension    med refill   . Diabetes    HPI Keaston Pile presents for   Diabetes: Complicated by hyperglycemia, and likely chronic kidney disease. Has not been evaluated since January, and at that time his exact regimen was unknown glimepiride and Metformin.Marland Kitchen  He was advised to bring medication with him to the office.  Planned on glimepiride versus Metformin changes once readings known Home readings February through March sent by message.  Low of 109, high of 219.  Primarily 130-140 range He does take statin and ARB. Feeling ok since last visit.  Taking amaryl 56m and metformin 5064mwith breakfast only  Last eGFR 51 on 05/2018.  No diarrhea on metformin, but has not taken higher doses.   Recent home readings: 130-140. No 200's. Lowest 76 about a month or two ago - asymptomatic.  Microalbumin: Normal ratio in January Optho, foot exam, pneumovax: Prevnar last year, plan for Pneumovax and flu vaccine today. Ophthalmology: OvArlean Hopping He does have ophthalmologist but has not been seen recently due to the pandemic.  Plans to call and schedule. Foot exam today: Diabetic Foot Exam - Simple   Simple Foot Form Diabetic Foot exam was performed with the following findings: Yes 04/29/2019  4:51 PM  Visual Inspection No deformities, no ulcerations, no other skin breakdown bilaterally: Yes Sensation Testing Intact to touch and monofilament testing bilaterally: Yes Pulse Check Posterior Tibialis and Dorsalis pulse intact bilaterally: Yes Comments      Immunization History  Administered Date(s) Administered  . Pneumococcal Conjugate-13 02/04/2018     Lab Results  Component Value Date   HGBA1C 9.2 (H) 06/02/2018   HGBA1C 10.1 (H) 03/02/2018   Lab Results  Component Value Date   LDLCALC 127 (H) 06/02/2018   CREATININE 1.47 (H)  06/02/2018   Hypertension: Irbesartan 150 mg daily.  Chronic kidney disease with creatinine ranging from 1.33-2.67 as a high in September 2019.  Range since September 2019 1.33-1.54. Ran out of irbesartan few days ago.  Home readings on meds: 120/80's.     BP Readings from Last 3 Encounters:  04/29/19 (!) 160/74  07/02/18 (!) 146/74  06/02/18 136/80   Lab Results  Component Value Date   CREATININE 1.47 (H) 06/02/2018   Hyperlipidemia: Lipitor 10 mg daily. No new myalgias, or other side effects of meds.  Outside work at home - less walking recently Has been wearing mask outside of home.   Lab Results  Component Value Date   CHOL 190 06/02/2018   HDL 45 06/02/2018   LDLCALC 127 (H) 06/02/2018   TRIG 91 06/02/2018   CHOLHDL 4.2 06/02/2018   Lab Results  Component Value Date   ALT 10 06/02/2018   AST 8 06/02/2018   ALKPHOS 68 06/02/2018   BILITOT 0.9 06/02/2018     History Patient Active Problem List   Diagnosis Date Noted  . Hypertension 03/02/2018  . Diabetes mellitus ([place] [place]/17/2019  . UTI (urinary tract infection) 03/01/2018  . Acute renal injury ([place]/16/2019   Past Medical History:  Diagnosis Date  . Diabetes mellitus without complication ([place]  . Hypertension   . UTI (urinary tract infection) 02/2018   Past Surgical History:  Procedure Laterality Date  . MULTIPLE TOOTH EXTRACTIONS     Allergies  Allergen Reactions  . Tylenol [Acetaminophen] Nausea And  Vomiting  . Lisinopril Cough   Prior to Admission medications   Medication Sig Start Date End Date Taking? Authorizing Provider  ACCU-CHEK AVIVA PLUS test strip USE 1 STRIP TO [place] DAILY 06/03/18   [provider]  atorvastatin (LIPITOR) 10 MG tablet Take 1/2 (one-half) tablet by mouth once daily 01/31/19   Wendie Agreste, MD  glimepiride (AMARYL) 4 MG tablet Take 1 tablet by mouth once daily with breakfast 03/25/19   Wendie Agreste, MD  irbesartan (AVAPRO) 150 MG  tablet Take 1 tablet by mouth once daily 03/25/19   Wendie Agreste, MD  metFORMIN (GLUCOPHAGE) 500 MG tablet Take 1 tablet by mouth once daily with breakfast 09/22/18   Wendie Agreste, MD  pantoprazole (PROTONIX) 20 MG tablet Take 20 mg by mouth daily as needed for heartburn or indigestion.  02/09/18   [provider]  tamsulosin (FLOMAX) 0.4 MG CAPS capsule Take 1 capsule (0.4 mg total) by mouth daily. 03/05/18   Kathi Ludwig, MD   Social History   Socioeconomic History  . Marital status: Married    Spouse name: Not on file  . Number of children: Not on file  . Years of education: Not on file  . Highest education level: Not on file  Occupational History  . Not on file  Social Needs  . Financial resource strain: Not on file  . Food insecurity    Worry: Not on file    Inability: Not on file  . Transportation needs    Medical: Not on file    Non-medical: Not on file  Tobacco Use  . Smoking status: Former Smoker    Quit date: 02/04/1978    Years since quitting: 41.2  . Smokeless tobacco: Never Used  Substance and Sexual Activity  . Alcohol use: Not Currently  . Drug use: Never  . Sexual activity: Not on file  Lifestyle  . Physical activity    Days per week: Not on file    Minutes per session: Not on file  . Stress: Not on file  Relationships  . Social Herbalist on phone: Not on file    Gets together: Not on file    Attends religious service: Not on file    Active member of club or organization: Not on file    Attends meetings of clubs or organizations: Not on file    Relationship status: Not on file  . Intimate partner violence    Fear of current or ex partner: Not on file    Emotionally abused: Not on file    Physically abused: Not on file    Forced sexual activity: Not on file  Other Topics Concern  . Not on file  Social History Narrative  . Not on file    Review of Systems  Constitutional: Negative for fatigue and unexpected weight  change.  Eyes: Negative for visual disturbance.  Respiratory: Negative for cough, chest tightness and shortness of breath.   Cardiovascular: Negative for chest pain, palpitations and leg swelling.  Gastrointestinal: Negative for abdominal pain and blood in stool.  Neurological: Negative for dizziness, light-headedness and headaches.     Objective:   Vitals:   04/29/19 1558  BP: (!) 160/74  Pulse: 78  Temp: (!) 97.5 F (36.4 C)  TempSrc: Oral  SpO2: 92%  Weight: 210 lb 6.4 oz (95.4 kg)  Height: '5\' 7"'  (1.702 m)     Physical Exam Vitals signs reviewed.  Constitutional:  Appearance: He is well-developed.  HENT:     Head: Normocephalic and atraumatic.  Eyes:     Pupils: Pupils are equal, round, and reactive to light.  Neck:     Vascular: No carotid bruit or JVD.  Cardiovascular:     Rate and Rhythm: Normal rate and regular rhythm.     Heart sounds: Normal heart sounds. No murmur.  Pulmonary:     Effort: Pulmonary effort is normal.     Breath sounds: Normal breath sounds. No rales.  Skin:    General: Skin is warm and dry.  Neurological:     Mental Status: He is alert and oriented to person, place, and time.    Results for orders placed or performed in visit on 06/02/18  Comprehensive metabolic panel  Result Value Ref Range   Glucose 227 (H) 65 - 99 mg/dL   BUN 15 8 - 27 mg/dL   Creatinine, Ser 1.47 (H) 0.76 - 1.27 mg/dL   GFR calc non Af Amer 44 (L) >59 mL/min/1.73   GFR calc Af Amer 51 (L) >59 mL/min/1.73   BUN/Creatinine Ratio 10 10 - 24   Sodium 140 134 - 144 mmol/L   Potassium 4.0 3.5 - 5.2 mmol/L   Chloride 102 96 - 106 mmol/L   CO2 24 20 - 29 mmol/L   Calcium 9.3 8.6 - 10.2 mg/dL   Total Protein 7.1 6.0 - 8.5 g/dL   Albumin 3.8 3.5 - 4.7 g/dL   Globulin, Total 3.3 1.5 - 4.5 g/dL   Albumin/Globulin Ratio 1.2 1.2 - 2.2   Bilirubin Total 0.9 0.0 - 1.2 mg/dL   Alkaline Phosphatase 68 39 - 117 IU/L   AST 8 0 - 40 IU/L   ALT 10 0 - 44 IU/L  Lipid  panel  Result Value Ref Range   Cholesterol, Total 190 100 - 199 mg/dL   Triglycerides 91 0 - 149 mg/dL   HDL 45 >39 mg/dL   VLDL Cholesterol Cal 18 5 - 40 mg/dL   LDL Calculated 127 (H) 0 - 99 mg/dL   Chol/HDL Ratio 4.2 0.0 - 5.0 ratio  Hemoglobin A1c  Result Value Ref Range   Hgb A1c MFr Bld 9.2 (H) 4.8 - 5.6 %   Est. average glucose Bld gHb Est-mCnc 217 mg/dL  Microalbumin / creatinine urine ratio  Result Value Ref Range   Creatinine, Urine 149.7 Not Estab. mg/dL   Microalbumin, Urine 10.6 Not Estab. ug/mL   Microalb/Creat Ratio 7.1 0.0 - 30.0 mg/g creat   Results for orders placed or performed in visit on 04/29/19  POCT glucose (manual entry)  Result Value Ref Range   POC Glucose 45 (A) 70 - 99 mg/dl  5:17 PM. Denies any symptoms. No HA, diaphoresis, fatigue. Last ate lunch at 11am. Glucose 15grams po given.   Results for orders placed or performed in visit on 04/29/19  POCT glucose (manual entry)  Result Value Ref Range   POC Glucose 45 (A) 70 - 99 mg/dl  POCT glycosylated hemoglobin (Hb A1C)  Result Value Ref Range   Hemoglobin A1C 7.4 (A) 4.0 - 5.6 %   HbA1c POC (<> result, manual entry)     HbA1c, POC (prediabetic range)     HbA1c, POC (controlled diabetic range)    POCT glucose (manual entry)  Result Value Ref Range   POC Glucose 106 (A) 70 - 99 mg/dl     Assessment & Plan:  Johnothan Bascomb is a 85 y.o. male . Type 2  diabetes mellitus with hyperglycemia, without long-term current use of insulin (HCC) - Plan: POCT glucose (manual entry), POCT glycosylated hemoglobin (Hb A1C), HM Diabetes Foot Exam, POCT glucose (manual entry), metFORMIN (GLUCOPHAGE) 500 MG tablet  - asymptomatic hypoglycemia in office. Resolved with glucose.   -stop sulfonyurea d/t risk of hypoglycemia.   - increase metformin to 55m BID.   - 2 week telemed recheck.    Need for influenza vaccination - Plan: Flu Vaccine QUAD High Dose(Fluad)  Hyperlipidemia, unspecified hyperlipidemia type -  Plan: Comprehensive metabolic panel, Lipid panel  Essential hypertension - Plan: Comprehensive metabolic panel Chronic kidney disease, unspecified CKD stage  - check labs. No med changes at this time. Closely monitor renal function with metformin use. Recheck in 2 weeks to ensure BP controlled back on meds.   Need for prophylactic vaccination against Streptococcus pneumoniae (pneumococcus) - Plan: Pneumovax (PPSV23)   Meds ordered this encounter  Medications  . metFORMIN (GLUCOPHAGE) 500 MG tablet    Sig: Take 1 tablet (500 mg total) by mouth 2 (two) times daily with a meal.    Dispense:  90 tablet    Refill:  0   Patient Instructions     Restart blood pressure medication.  If home readings are over 140/90 once you are back on medications, follow-up to discuss further.   Schedule appointment with your eye care provider for diabetic eye exam when possible. No change in cholesterol medication today. Stop glimepiride as that can cause your blood sugars to run too low like today.   Increase metformin to 500 mg twice per day.  We will need to keep a close eye on your kidney function with use of Metformin.  Recheck in 2 weeks with telemed visit. If you have lab work done today you will be contacted with your lab results within the next 2 weeks.  If you have not heard from [place] please contact [place] The fastest way to get your results is to register for My Chart.   IF you received an x-ray today, you will receive an invoice [facility]. Please contact [facility] at [phone] questions or concerns regarding your invoice.   IF you received labwork today, you will receive an invoice from [place] Please contact [facility] at [phone] questions or concerns regarding your invoice.   Our billing staff will not be able to assist you with questions regarding bills from these companies.  You will be contacted with the lab results as soon as they are available.  The fastest way to get your results is to activate your My Chart account. Instructions are located on the last page of this paperwork. If you have not heard from [place] results in 2 weeks, please contact this office.          Signed, JMerri Ray MD [facility]

## 2019-04-30 LAB — COMPREHENSIVE METABOLIC PANEL
ALT: 11 IU/L (ref 0–44)
AST: 13 IU/L (ref 0–40)
Albumin/Globulin Ratio: 1.6 (ref 1.2–2.2)
Albumin: 4.5 g/dL (ref 3.6–4.6)
Alkaline Phosphatase: 84 IU/L (ref 39–117)
BUN/Creatinine Ratio: 12 (ref 10–24)
BUN: 19 mg/dL (ref 8–27)
Bilirubin Total: 0.9 mg/dL (ref 0.0–1.2)
CO2: 23 mmol/L (ref 20–29)
Calcium: 9.6 mg/dL (ref 8.6–10.2)
Chloride: 105 mmol/L (ref 96–106)
Creatinine, Ser: 1.61 mg/dL — ABNORMAL HIGH (ref 0.76–1.27)
GFR calc Af Amer: 45 mL/min/{1.73_m2} — ABNORMAL LOW (ref >59)
GFR calc non Af Amer: 39 mL/min/{1.73_m2} — ABNORMAL LOW (ref >59)
Globulin, Total: 2.9 g/dL (ref 1.5–4.5)
Glucose: 51 mg/dL — ABNORMAL LOW (ref 65–99)
Potassium: 3.9 mmol/L (ref 3.5–5.2)
Sodium: 145 mmol/L — ABNORMAL HIGH (ref 134–144)
Total Protein: 7.4 g/dL (ref 6.0–8.5)

## 2019-04-30 LAB — LIPID PANEL
Chol/HDL Ratio: 2.9 ratio (ref 0.0–5.0)
Cholesterol, Total: 137 mg/dL (ref 100–199)
HDL: 47 mg/dL (ref >39)
LDL Chol Calc (NIH): 73 mg/dL (ref 0–99)
Triglycerides: 91 mg/dL (ref 0–149)
VLDL Cholesterol Cal: 17 mg/dL (ref 5–40)

## 2019-04-30 MED ORDER — ATORVASTATIN CALCIUM 10 MG PO TABS: 10.0000 mg | ORAL_TABLET | Freq: Every day | ORAL | 0 refills | Status: AC

## 2019-04-30 MED ORDER — IRBESARTAN 150 MG PO TABS: 150.0000 mg | ORAL_TABLET | Freq: Every day | ORAL | 1 refills | Status: DC

## 2019-05-13 ENCOUNTER — Telehealth (INDEPENDENT_AMBULATORY_CARE_PROVIDER_SITE_OTHER): Payer: Medicare Other | Admitting: Family Medicine

## 2019-05-13 ENCOUNTER — Other Ambulatory Visit: Payer: Self-pay

## 2019-05-13 DIAGNOSIS — E1165 Type 2 diabetes mellitus with hyperglycemia: Secondary | ICD-10-CM | POA: Diagnosis not present

## 2019-05-13 DIAGNOSIS — R7989 Other specified abnormal findings of blood chemistry: Secondary | ICD-10-CM | POA: Diagnosis not present

## 2019-05-13 NOTE — Progress Notes (Signed)
CC- 2 week f/u on diabetes-Sun after visit here in office blood sugar was 152 am, 174, 146,185,161,121,133,138,140,145,152, Blood sugar today was 139. Patients wife said he has been doing good since changed meds. Blood sugar have not been running too low.

## 2019-05-13 NOTE — Progress Notes (Signed)
Virtual Visit via Telephone Note  I connected with Lawrence Higgins on 05/13/19 at 1:32 PM by telephone and verified that I am speaking with the correct person using two identifiers.   I discussed the limitations, risks, security and privacy concerns of performing an evaluation and management service by telephone and the availability of in person appointments. I also discussed with the patient that there may be a patient responsible charge related to this service. The patient expressed understanding and agreed to proceed, consent obtained.   Wife on call as well.   Chief complaint:   diabetes  History of Present Illness: Lawrence Higgins is a 85 y.o. male  Diabetes: Previously complicated by hyperglycemia.  Did have asymptomatic hypoglycemia at last office visit November 13.  Decided to stop glimepiride at that time due to risk of hypoglycemia. Unfortunately creatinine has also been elevated, with higher reading recently, GFR estimated at 45.  Plan to continue Metformin with close follow-up of creatinine.   Home readings 152, 174, 146, 185, 161, 121, 133, 138, 140, 145, 152.  Today's blood sugar 139. No symptomatic low blood sugars since last visit, nothing under 100, no 200's.  Still taking metformin 500 mg BID.   Lab Results  Component Value Date   HGBA1C 7.4 (A) 04/29/2019   Lab Results  Component Value Date   CREATININE 1.61 (H) 04/29/2019      Patient Active Problem List   Diagnosis Date Noted  . Hypertension 03/02/2018  . Diabetes mellitus ([place]) 03/02/2018  . UTI (urinary tract infection) 03/01/2018  . Acute renal injury ([place]) 03/01/2018   Past Medical History:  Diagnosis Date  . Diabetes mellitus without complication ([place])   . Hypertension   . UTI (urinary tract infection) 02/2018   Past Surgical History:  Procedure Laterality Date  . MULTIPLE TOOTH EXTRACTIONS     Allergies  Allergen Reactions  . Tylenol [Acetaminophen] Nausea And Vomiting  . Lisinopril Cough    Prior to Admission medications   Medication Sig Start Date End Date Taking? Authorizing Provider  ACCU-CHEK AVIVA PLUS test strip USE 1 STRIP TO CHECK GLUCOSE TWICE DAILY 06/03/18  Yes [provider]  atorvastatin (LIPITOR) 10 MG tablet Take 1 tablet (10 mg total) by mouth daily at 6 PM. 04/30/19  Yes Wendie Agreste, MD  irbesartan (AVAPRO) 150 MG tablet Take 1 tablet (150 mg total) by mouth daily. 04/30/19  Yes Wendie Agreste, MD  metFORMIN (GLUCOPHAGE) 500 MG tablet Take 1 tablet (500 mg total) by mouth 2 (two) times daily with a meal. 04/29/19  Yes Wendie Agreste, MD  pantoprazole (PROTONIX) 20 MG tablet Take 20 mg by mouth daily as needed for heartburn or indigestion.  02/09/18  Yes [provider]  tamsulosin (FLOMAX) 0.4 MG CAPS capsule Take 1 capsule (0.4 mg total) by mouth daily. 03/05/18  Yes Kathi Ludwig, MD   Social History   Socioeconomic History  . Marital status: Married    Spouse name: Not on file  . Number of children: Not on file  . Years of education: Not on file  . Highest education level: Not on file  Occupational History  . Not on file  Social Needs  . Financial resource strain: Not on file  . Food insecurity    Worry: Not on file    Inability: Not on file  . Transportation needs    Medical: Not on file    Non-medical: Not on file  Tobacco Use  . Smoking status:  Former Smoker    Quit date: 02/04/1978    Years since quitting: 41.2  . Smokeless tobacco: Never Used  Substance and Sexual Activity  . Alcohol use: Not Currently  . Drug use: Never  . Sexual activity: Not on file  Lifestyle  . Physical activity    Days per week: Not on file    Minutes per session: Not on file  . Stress: Not on file  Relationships  . Social Herbalist on phone: Not on file    Gets together: Not on file    Attends religious service: Not on file    Active member of club or organization: Not on file    Attends meetings of clubs or  organizations: Not on file    Relationship status: Not on file  . Intimate partner violence    Fear of current or ex partner: Not on file    Emotionally abused: Not on file    Physically abused: Not on file    Forced sexual activity: Not on file  Other Topics Concern  . Not on file  Social History Narrative  . Not on file     Observations/Objective: No distress.  There were no vitals filed for this visit. Home glucose 139.   Assessment and Plan: Elevated serum creatinine  Type 2 diabetes mellitus with hyperglycemia, without long-term current use of insulin (HCC) Improved on home readings without hypoglycemia nor readings in the 200s.  Just recently stopped glimepiride.   - continue metformin same dose for now but close monitoring of renal function as eGFR borderline for safe use of Metformin.    -Plan on lab only visit in 2 weeks.  Understanding of plan expressed.  Follow Up Instructions: 2 week lab only visit.    I discussed the assessment and treatment plan with the patient. The patient was provided an opportunity to ask questions and all were answered. The patient agreed with the plan and demonstrated an understanding of the instructions.   The patient was advised to call back or seek an in-person evaluation if the symptoms worsen or if the condition fails to improve as anticipated.  I provided 5 minutes of non-face-to-face time during this encounter.  Signed,   Merri Ray, MD [facility].  05/13/19

## 2019-05-27 ENCOUNTER — Ambulatory Visit: Payer: Medicare Other

## 2019-05-27 ENCOUNTER — Other Ambulatory Visit: Payer: Self-pay

## 2019-05-27 DIAGNOSIS — E1165 Type 2 diabetes mellitus with hyperglycemia: Secondary | ICD-10-CM

## 2019-05-27 DIAGNOSIS — R7989 Other specified abnormal findings of blood chemistry: Secondary | ICD-10-CM

## 2019-05-28 LAB — BASIC METABOLIC PANEL
BUN/Creatinine Ratio: 12 (ref 10–24)
BUN: 17 mg/dL (ref 8–27)
CO2: 25 mmol/L (ref 20–29)
Calcium: 9.5 mg/dL (ref 8.6–10.2)
Chloride: 103 mmol/L (ref 96–106)
Creatinine, Ser: 1.46 mg/dL — ABNORMAL HIGH (ref 0.76–1.27)
GFR calc Af Amer: 51 mL/min/{1.73_m2} — ABNORMAL LOW (ref >59)
GFR calc non Af Amer: 44 mL/min/{1.73_m2} — ABNORMAL LOW (ref >59)
Glucose: 136 mg/dL — ABNORMAL HIGH (ref 65–99)
Potassium: 4.5 mmol/L (ref 3.5–5.2)
Sodium: 143 mmol/L (ref 134–144)

## 2019-06-16 ENCOUNTER — Other Ambulatory Visit: Payer: Self-pay | Admitting: Family Medicine

## 2019-06-16 DIAGNOSIS — E1165 Type 2 diabetes mellitus with hyperglycemia: Secondary | ICD-10-CM

## 2019-08-07 ENCOUNTER — Other Ambulatory Visit: Payer: Self-pay | Admitting: Family Medicine

## 2019-08-07 DIAGNOSIS — E1165 Type 2 diabetes mellitus with hyperglycemia: Secondary | ICD-10-CM

## 2019-09-21 ENCOUNTER — Other Ambulatory Visit: Payer: Self-pay | Admitting: Family Medicine

## 2019-09-21 ENCOUNTER — Other Ambulatory Visit: Payer: Self-pay | Admitting: Urology

## 2019-09-21 DIAGNOSIS — E1165 Type 2 diabetes mellitus with hyperglycemia: Secondary | ICD-10-CM

## 2019-10-26 ENCOUNTER — Other Ambulatory Visit: Payer: Self-pay | Admitting: Family Medicine

## 2019-10-26 DIAGNOSIS — I10 Essential (primary) hypertension: Secondary | ICD-10-CM

## 2019-12-28 ENCOUNTER — Other Ambulatory Visit: Payer: Self-pay | Admitting: Family Medicine

## 2019-12-28 DIAGNOSIS — E1165 Type 2 diabetes mellitus with hyperglycemia: Secondary | ICD-10-CM

## 2019-12-28 NOTE — Telephone Encounter (Signed)
Requested medication (s) are due for refill today:yes  Requested medication (s) are on the active medication list: yes  Last refill:  09/21/2019  Future visit scheduled no  Notes to clinic: patient overdue for  labs and follow up Patient given courtesy refill on 09/21/2019   Requested Prescriptions  Pending Prescriptions Disp Refills   metFORMIN (GLUCOPHAGE) 500 MG tablet [Pharmacy Med Name: metFORMIN HCl 500 MG Oral Tablet] 180 tablet 0    Sig: TAKE 1 TABLET BY MOUTH TWICE DAILY WITH A MEAL      Endocrinology:  Diabetes - Biguanides Failed - 12/28/2019 11:27 AM      Failed - Cr in normal range and within 360 days    Creatinine, Ser  Date Value Ref Range Status  05/27/2019 1.46 (H) 0.76 - 1.27 mg/dL Final   Creatinine, Urine  Date Value Ref Range Status  03/01/2018 176.42 mg/dL Final    Comment:    Performed at [facility], [place] [address], [place], [place] [address]          Failed - HBA1C is between 0 and 7.9 and within 180 days    Hemoglobin A1C  Date Value Ref Range Status  04/29/2019 7.4 (A) 4.0 - 5.6 % Final   Hgb A1c MFr Bld  Date Value Ref Range Status  06/02/2018 9.2 (H) 4.8 - 5.6 % Final    Comment:             Prediabetes: 5.7 - 6.4          Diabetes: >6.4          Glycemic control for adults with diabetes: <7.0           Failed - eGFR in normal range and within 360 days    GFR calc Af Amer  Date Value Ref Range Status  05/27/2019 51 (L) >59 mL/min/1.73 Final   GFR calc non Af Amer  Date Value Ref Range Status  05/27/2019 44 (L) >59 mL/min/1.73 Final          Failed - Valid encounter within last 6 months    Recent Outpatient Visits           7 months ago Elevated serum creatinine   [facility], MD   8 months ago Type 2 diabetes mellitus with hyperglycemia, without long-term current use of insulin [facility])   [facility], Ranell Patrick, MD   1 year ago Type 2 diabetes mellitus with hyperglycemia,  without long-term current use of insulin [facility])   [facility], Ranell Patrick, MD   1 year ago Type 2 diabetes mellitus with hyperglycemia, without long-term current use of insulin [facility])   [facility], Ranell Patrick, MD   1 year ago Type 2 diabetes mellitus with hyperglycemia, without long-term current use of insulin [facility])   [facility], Ranell Patrick, MD

## 2020-02-22 ENCOUNTER — Other Ambulatory Visit: Payer: Self-pay | Admitting: Urology

## 2020-04-23 ENCOUNTER — Other Ambulatory Visit: Payer: Self-pay | Admitting: Family Medicine

## 2020-04-23 DIAGNOSIS — I10 Essential (primary) hypertension: Secondary | ICD-10-CM

## 2020-04-23 NOTE — Telephone Encounter (Signed)
Requested medication (s) are due for refill today: yes  Requested medication (s) are on the active medication list: yes  Last refill:  01/23/20  Future visit scheduled: no  Notes to clinic:  no valid encounter within last 6 months   Requested Prescriptions  Pending Prescriptions Disp Refills   irbesartan (AVAPRO) 150 MG tablet [Pharmacy Med Name: Irbesartan 150 MG Oral Tablet] 90 tablet 0    Sig: Take 1 tablet by mouth once daily      Cardiovascular:  Angiotensin Receptor Blockers Failed - 04/23/2020  2:51 PM      Failed - Cr in normal range and within 180 days    Creatinine, Ser  Date Value Ref Range Status  05/27/2019 1.46 (H) 0.76 - 1.27 mg/dL Final   Creatinine, Urine  Date Value Ref Range Status  03/01/2018 176.42 mg/dL Final    Comment:    Performed at [facility], [address], [place], [place] [address]          Failed - K in normal range and within 180 days    Potassium  Date Value Ref Range Status  05/27/2019 4.5 3.5 - 5.2 mmol/L Final          Failed - Valid encounter within last 6 months    Recent Outpatient Visits           11 months ago Elevated serum creatinine   [facility], Asencion Partridge, MD   12 months ago Type 2 diabetes mellitus with hyperglycemia, without long-term current use of insulin [facility])   [facility], Asencion Partridge, MD   1 year ago Type 2 diabetes mellitus with hyperglycemia, without long-term current use of insulin [facility])   [facility], Asencion Partridge, MD   1 year ago Type 2 diabetes mellitus with hyperglycemia, without long-term current use of insulin [facility])   [facility], Asencion Partridge, MD   2 years ago Type 2 diabetes mellitus with hyperglycemia, without long-term current use of insulin [facility])   [facility], Asencion Partridge, MD              Passed - Patient is not pregnant      Passed - Last BP in normal range    BP Readings from Last 1 Encounters:   04/29/19 132/70

## 2020-04-30 ENCOUNTER — Encounter: Payer: Self-pay | Admitting: Registered Nurse

## 2020-04-30 ENCOUNTER — Other Ambulatory Visit: Payer: Self-pay

## 2020-04-30 ENCOUNTER — Ambulatory Visit (INDEPENDENT_AMBULATORY_CARE_PROVIDER_SITE_OTHER): Payer: Medicare Other | Admitting: Registered Nurse

## 2020-04-30 VITALS — BP 153/67 | HR 57 | Temp 98.3°F | Ht 68.0 in | Wt 199.2 lb

## 2020-04-30 DIAGNOSIS — N189 Chronic kidney disease, unspecified: Secondary | ICD-10-CM | POA: Diagnosis not present

## 2020-04-30 DIAGNOSIS — E1165 Type 2 diabetes mellitus with hyperglycemia: Secondary | ICD-10-CM | POA: Diagnosis not present

## 2020-04-30 DIAGNOSIS — I10 Essential (primary) hypertension: Secondary | ICD-10-CM

## 2020-04-30 LAB — POCT GLYCOSYLATED HEMOGLOBIN (HGB A1C): Hemoglobin A1C: 8.1 % — AB (ref 4.0–5.6)

## 2020-04-30 MED ORDER — METFORMIN HCL 500 MG PO TABS: 500.0000 mg | ORAL_TABLET | Freq: Two times a day (BID) | ORAL | 3 refills | Status: DC

## 2020-04-30 MED ORDER — IRBESARTAN 150 MG PO TABS: 150.0000 mg | ORAL_TABLET | Freq: Every day | ORAL | 1 refills | Status: DC

## 2020-04-30 NOTE — Patient Instructions (Signed)
° ° ° °  If you have lab work done today you will be contacted with your lab results within the next 2 weeks.  If you have not heard from us then please contact us. The fastest way to get your results is to register for My Chart. ° ° °IF you received an x-ray today, you will receive an invoice from [facility]. Please contact [facility] at [phone] with questions or concerns regarding your invoice.  ° °IF you received labwork today, you will receive an invoice from [facility]. Please contact [facility] at [phone] with questions or concerns regarding your invoice.  ° °Our billing staff will not be able to assist you with questions regarding bills from these companies. ° °You will be contacted with the lab results as soon as they are available. The fastest way to get your results is to activate your My Chart account. Instructions are located on the last page of this paperwork. If you have not heard from us regarding the results in 2 weeks, please contact this office. °  ° ° ° °

## 2020-05-01 LAB — COMPREHENSIVE METABOLIC PANEL
ALT: 8 IU/L (ref 0–44)
AST: 10 IU/L (ref 0–40)
Albumin/Globulin Ratio: 1.3 (ref 1.2–2.2)
Albumin: 4 g/dL (ref 3.6–4.6)
Alkaline Phosphatase: 80 IU/L (ref 44–121)
BUN/Creatinine Ratio: 10 (ref 10–24)
BUN: 15 mg/dL (ref 8–27)
Bilirubin Total: 1 mg/dL (ref 0.0–1.2)
CO2: 26 mmol/L (ref 20–29)
Calcium: 9.4 mg/dL (ref 8.6–10.2)
Chloride: 103 mmol/L (ref 96–106)
Creatinine, Ser: 1.55 mg/dL — ABNORMAL HIGH (ref 0.76–1.27)
GFR calc Af Amer: 47 mL/min/{1.73_m2} — ABNORMAL LOW (ref >59)
GFR calc non Af Amer: 41 mL/min/{1.73_m2} — ABNORMAL LOW (ref >59)
Globulin, Total: 3.1 g/dL (ref 1.5–4.5)
Glucose: 178 mg/dL — ABNORMAL HIGH (ref 65–99)
Potassium: 4.3 mmol/L (ref 3.5–5.2)
Sodium: 142 mmol/L (ref 134–144)
Total Protein: 7.1 g/dL (ref 6.0–8.5)

## 2020-05-01 LAB — CBC
Hematocrit: 43.1 % (ref 37.5–51.0)
Hemoglobin: 14.4 g/dL (ref 13.0–17.7)
MCH: 31.8 pg (ref 26.6–33.0)
MCHC: 33.4 g/dL (ref 31.5–35.7)
MCV: 95 fL (ref 79–97)
Platelets: 153 10*3/uL (ref 150–450)
RBC: 4.53 x10E6/uL (ref 4.14–5.80)
RDW: 13.4 % (ref 11.6–15.4)
WBC: 7.8 10*3/uL (ref 3.4–10.8)

## 2020-05-01 LAB — LIPID PANEL
Chol/HDL Ratio: 3.9 ratio (ref 0.0–5.0)
Cholesterol, Total: 173 mg/dL (ref 100–199)
HDL: 44 mg/dL (ref >39)
LDL Chol Calc (NIH): 103 mg/dL — ABNORMAL HIGH (ref 0–99)
Triglycerides: 145 mg/dL (ref 0–149)
VLDL Cholesterol Cal: 26 mg/dL (ref 5–40)

## 2020-05-01 NOTE — Progress Notes (Signed)
Dr. Neva Seat - FYI  Lab:  Please send letter to patient:  Kidneys about steady. Blood counts much better. Cholesterol a little higher than it had been - keep an eye on diet, try to maintain some activity.  Follow up with Dr. Neva Seat in 3 months to recheck sugars.  Thank you  Jari Sportsman, NP

## 2020-05-02 ENCOUNTER — Other Ambulatory Visit: Payer: Self-pay

## 2020-05-02 ENCOUNTER — Ambulatory Visit (INDEPENDENT_AMBULATORY_CARE_PROVIDER_SITE_OTHER): Payer: Medicare Other | Admitting: Family Medicine

## 2020-05-02 ENCOUNTER — Other Ambulatory Visit: Payer: Self-pay | Admitting: Registered Nurse

## 2020-05-02 DIAGNOSIS — Z23 Encounter for immunization: Secondary | ICD-10-CM | POA: Diagnosis not present

## 2020-05-02 NOTE — Progress Notes (Signed)
Lab Letter SENT  

## 2020-05-15 ENCOUNTER — Other Ambulatory Visit: Payer: Self-pay | Admitting: Urology

## 2020-07-09 ENCOUNTER — Encounter: Payer: Self-pay | Admitting: Registered Nurse

## 2020-07-09 NOTE — Progress Notes (Signed)
Established Patient Office Visit  Subjective:  Patient ID: Lawrence Higgins, male    DOB: Aug 13, 1933  Age: 87 y.o. MRN: [id number]  CC:  Chief Complaint  Patient presents with  . Medication Refill  . Hypertension  . Diabetes    HPI Lawrence Higgins presents for medication refills  Hypertension: Patient Currently taking: irbesartan 150mg  PO qd Good effect. No AEs. Denies CV symptoms including: chest pain, shob, doe, headache, visual changes, fatigue, claudication, and dependent edema.   Previous readings and labs: BP Readings from Last 3 Encounters:  04/30/20 (!) 153/67  04/29/19 132/70  07/02/18 (!) 146/74   Lab Results  Component Value Date   CREATININE 1.55 (H) 04/30/2020    Recent labs reviewed showing a few concerns - blood counts low, renal function declining. Will repeat today. Discussed implications with patient.    Last A1c:  Lab Results  Component Value Date   HGBA1C 8.1 (A) 04/30/2020    Currently taking: metformin 500mg  PO qd No new complications Reports good compliance with medications Diet has been worsening Exercise habits have been steady, limited  Past Medical History:  Diagnosis Date  . Diabetes mellitus without complication (HCC)   . Hypertension   . UTI (urinary tract infection) 02/2018    Past Surgical History:  Procedure Laterality Date  . MULTIPLE TOOTH EXTRACTIONS      No family history on file.  Social History   Socioeconomic History  . Marital status: Married    Spouse name: Not on file  . Number of children: Not on file  . Years of education: Not on file  . Highest education level: Not on file  Occupational History  . Not on file  Tobacco Use  . Smoking status: Former Smoker    Quit date: 02/04/1978    Years since quitting: 42.4  . Smokeless tobacco: Never Used  Vaping Use  . Vaping Use: Never used  Substance and Sexual Activity  . Alcohol use: Not Currently  . Drug use: Never  . Sexual activity: Not on file  Other  Topics Concern  . Not on file  Social History Narrative  . Not on file   Social Determinants of Health   Financial Resource Strain: Not on file  Food Insecurity: Not on file  Transportation Needs: Not on file  Physical Activity: Not on file  Stress: Not on file  Social Connections: Not on file  Intimate Partner Violence: Not on file    Outpatient Medications Prior to Visit  Medication Sig Dispense Refill  . ACCU-CHEK AVIVA PLUS test strip USE 1 STRIP TO CHECK GLUCOSE TWICE DAILY    . atorvastatin (LIPITOR) 10 MG tablet Take 1 tablet (10 mg total) by mouth daily at 6 PM. 45 tablet 0  . pantoprazole (PROTONIX) 20 MG tablet Take 20 mg by mouth daily as needed for heartburn or indigestion.   4  . irbesartan (AVAPRO) 150 MG tablet Take 1 tablet by mouth once daily 90 tablet 1  . metFORMIN (GLUCOPHAGE) 500 MG tablet TAKE 1 TABLET BY MOUTH TWICE DAILY WITH A MEAL 60 tablet 0  . tamsulosin (FLOMAX) 0.4 MG CAPS capsule Take 1 capsule by mouth at bedtime 90 capsule 0   No facility-administered medications prior to visit.    Allergies  Allergen Reactions  . Tylenol [Acetaminophen] Nausea And Vomiting  . Lisinopril Cough    ROS Review of Systems  Constitutional: Negative.   HENT: Negative.   Eyes: Negative.   Respiratory: Negative.   Cardiovascular:  Negative.   Gastrointestinal: Negative.   Genitourinary: Negative.   Musculoskeletal: Negative.   Skin: Negative.   Neurological: Negative.   Psychiatric/Behavioral: Negative.   All other systems reviewed and are negative.     Objective:    Physical Exam Constitutional:      General: He is not in acute distress.    Appearance: Normal appearance. He is normal weight. He is not ill-appearing, toxic-appearing or diaphoretic.  Cardiovascular:     Rate and Rhythm: Normal rate and regular rhythm.     Heart sounds: Normal heart sounds. No murmur heard. No friction rub. No gallop.   Pulmonary:     Effort: Pulmonary effort is  normal. No respiratory distress.     Breath sounds: Normal breath sounds. No stridor. No wheezing, rhonchi or rales.  Chest:     Chest wall: No tenderness.  Neurological:     General: No focal deficit present.     Mental Status: He is alert and oriented to person, place, and time. Mental status is at baseline.  Psychiatric:        Mood and Affect: Mood normal.        Behavior: Behavior normal.        Thought Content: Thought content normal.        Judgment: Judgment normal.     BP (!) 153/67   Pulse (!) 57   Temp 98.3 F (36.8 C) (Temporal)   Ht 5\' 8"  (1.727 m)   Wt 199 lb 3.2 oz (90.4 kg)   SpO2 92%   BMI 30.29 kg/m  Wt Readings from Last 3 Encounters:  04/30/20 199 lb 3.2 oz (90.4 kg)  04/29/19 210 lb 6.4 oz (95.4 kg)  07/02/18 205 lb (93 kg)     Health Maintenance Due  Topic Date Due  . COVID-19 Vaccine (1) Never done  . OPHTHALMOLOGY EXAM  Never done  . TETANUS/TDAP  Never done  . FOOT EXAM  04/28/2020    There are no preventive care reminders to display for this patient.  No results found for: TSH Lab Results  Component Value Date   WBC 7.8 04/30/2020   HGB 14.4 04/30/2020   HCT 43.1 04/30/2020   MCV 95 04/30/2020   PLT 153 04/30/2020   Lab Results  Component Value Date   NA 142 04/30/2020   K 4.3 04/30/2020   CO2 26 04/30/2020   GLUCOSE 178 (H) 04/30/2020   BUN 15 04/30/2020   CREATININE 1.55 (H) 04/30/2020   BILITOT 1.0 04/30/2020   ALKPHOS 80 04/30/2020   AST 10 04/30/2020   ALT 8 04/30/2020   PROT 7.1 04/30/2020   ALBUMIN 4.0 04/30/2020   CALCIUM 9.4 04/30/2020   ANIONGAP 6 03/04/2018   Lab Results  Component Value Date   CHOL 173 04/30/2020   Lab Results  Component Value Date   HDL 44 04/30/2020   Lab Results  Component Value Date   LDLCALC 103 (H) 04/30/2020   Lab Results  Component Value Date   TRIG 145 04/30/2020   Lab Results  Component Value Date   CHOLHDL 3.9 04/30/2020   Lab Results  Component Value Date    HGBA1C 8.1 (A) 04/30/2020      Assessment & Plan:   Problem List Items Addressed This Visit      Endocrine   Diabetes mellitus (HCC) (Chronic)   Relevant Medications   metFORMIN (GLUCOPHAGE) 500 MG tablet   irbesartan (AVAPRO) 150 MG tablet   Other Relevant Orders  POCT glycosylated hemoglobin (Hb A1C) (Completed)   Comprehensive metabolic panel (Completed)   Lipid Panel (Completed)    Other Visit Diagnoses    Chronic kidney disease, unspecified CKD stage    -  Primary   Relevant Orders   CBC (Completed)   Essential hypertension       Relevant Medications   irbesartan (AVAPRO) 150 MG tablet   Other Relevant Orders   Lipid Panel (Completed)      Meds ordered this encounter  Medications  . metFORMIN (GLUCOPHAGE) 500 MG tablet    Sig: Take 1 tablet (500 mg total) by mouth 2 (two) times daily with a meal.    Dispense:  180 tablet    Refill:  3    Pt needs an appointment for future refills  . irbesartan (AVAPRO) 150 MG tablet    Sig: Take 1 tablet (150 mg total) by mouth daily.    Dispense:  90 tablet    Refill:  1    Follow-up: No follow-ups on file.   PLAN  Improve diet and try to be more active. a1c elevation today. Prefer control under 7.5 or as determined by PCP  Continue other meds as prescribed  Return as scheduled with pcp  Patient encouraged to call clinic with any questions, comments, or concerns.  Janeece Agee, NP

## 2020-08-20 ENCOUNTER — Other Ambulatory Visit: Payer: Self-pay | Admitting: Urology

## 2020-08-20 IMAGING — US US RENAL
1 series · 14 of 22 positions shown · non-contrast
Comparison: None.

CLINICAL DATA: Acute renal injury.

EXAM:
RENAL / URINARY TRACT ULTRASOUND COMPLETE

[Series 1: us renal · 0.26mm/px · 14 of 22 slices shown]
[im 1/22]
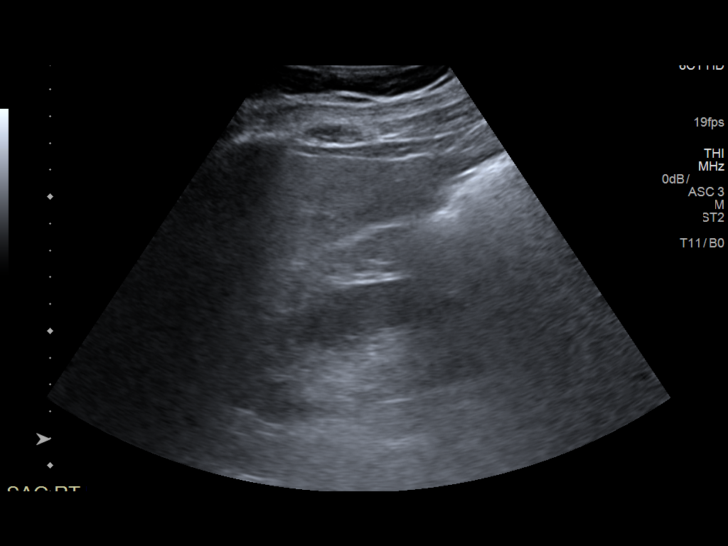
[im 3/22]
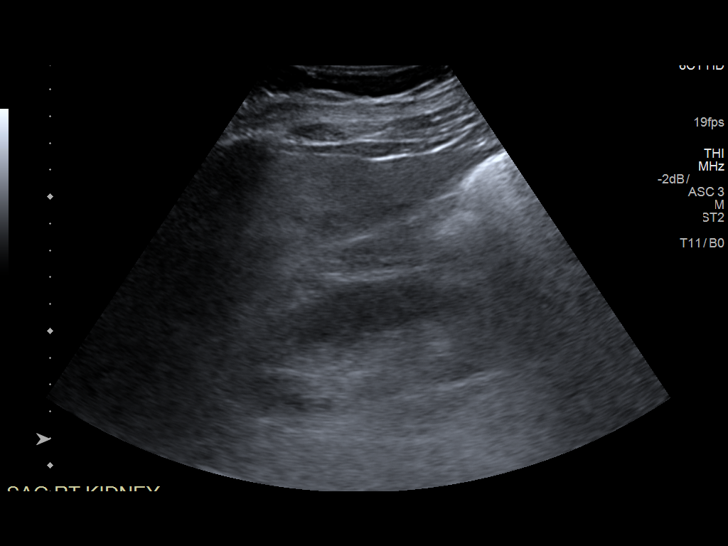
[im 4/22]
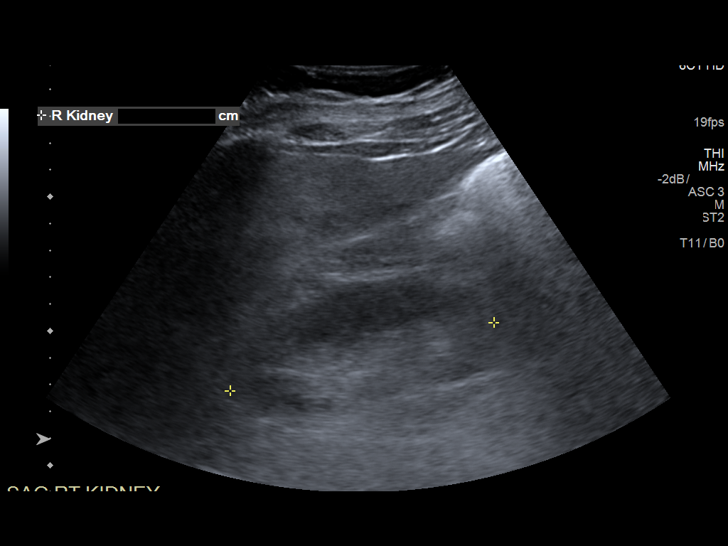
[im 6/22]
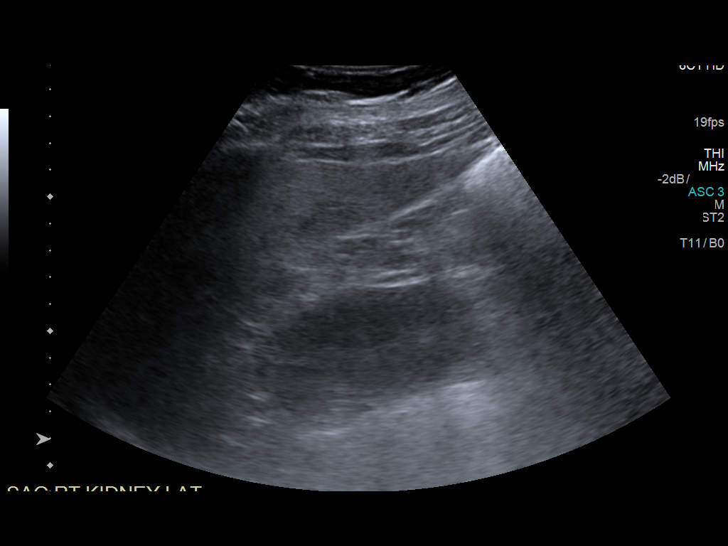
[im 8/22]
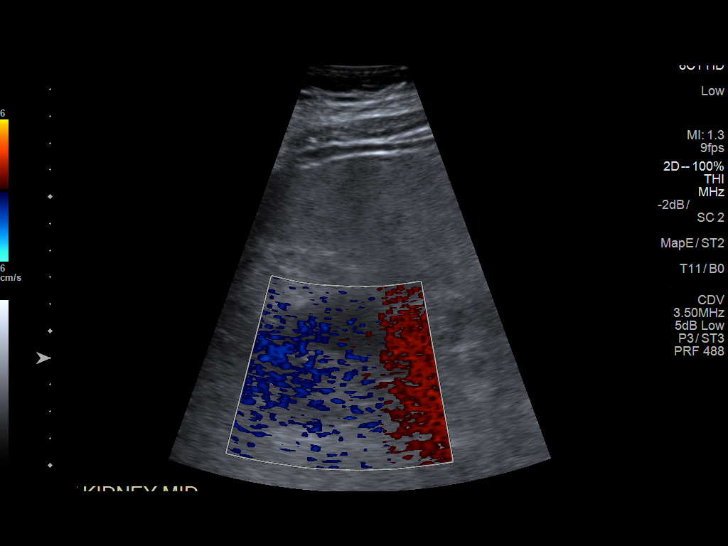
[im 9/22]
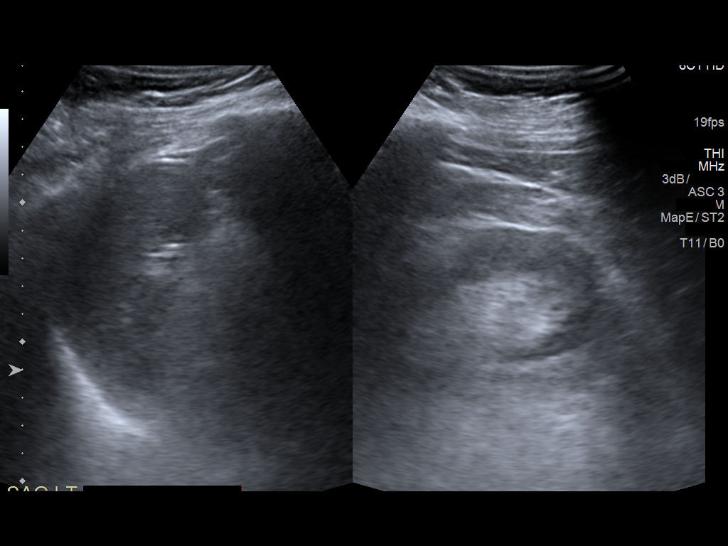
[im 11/22]
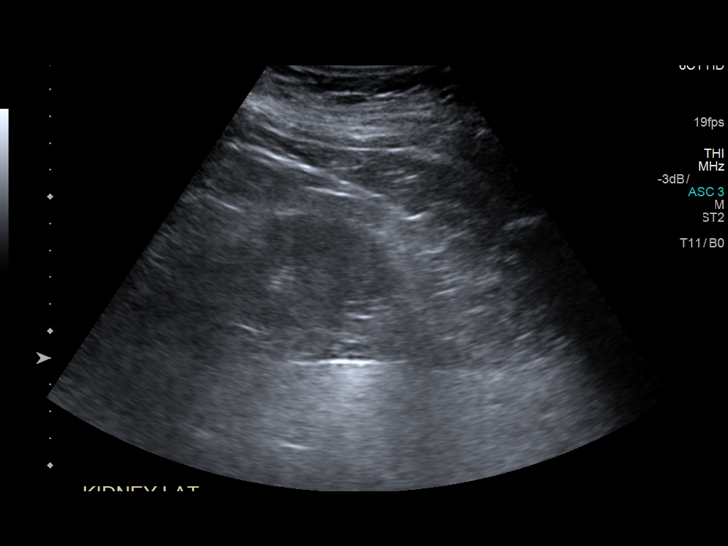
[im 12/22]
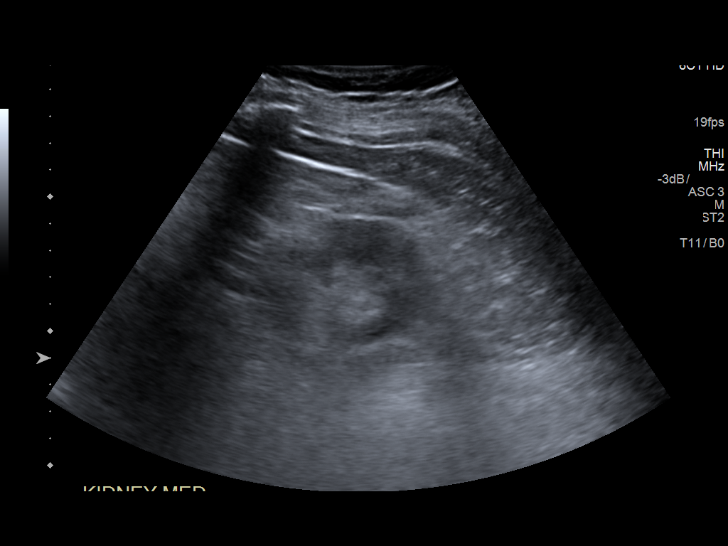
[im 14/22]
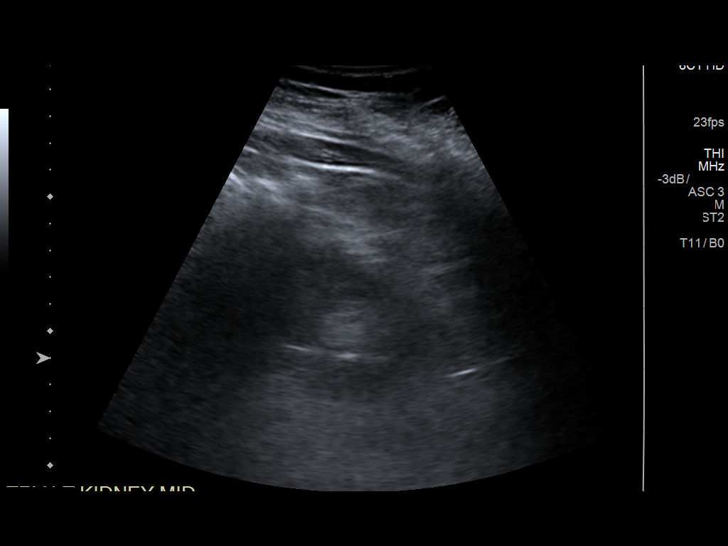
[im 15/22]
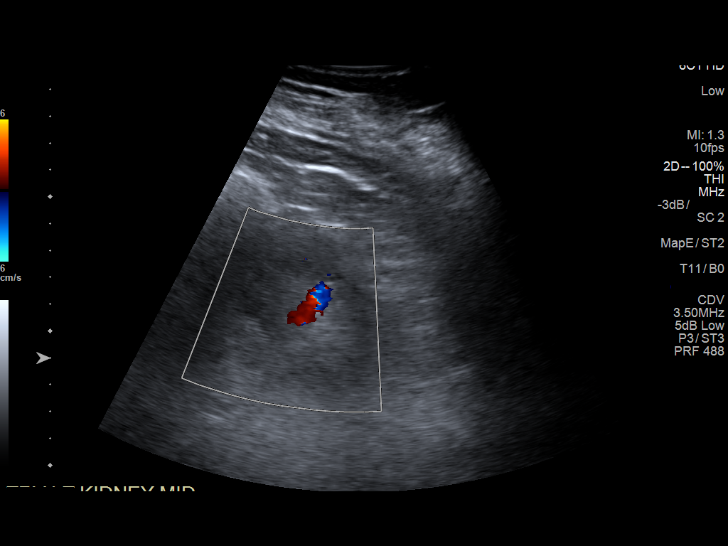
[im 17/22]
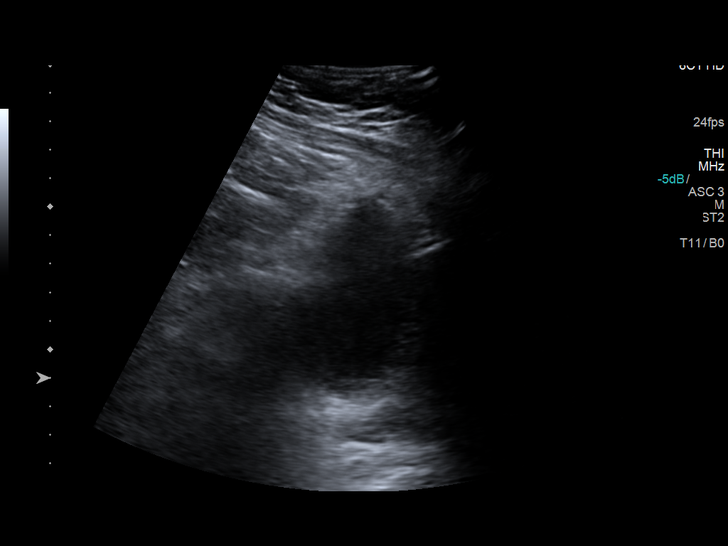
[im 19/22]
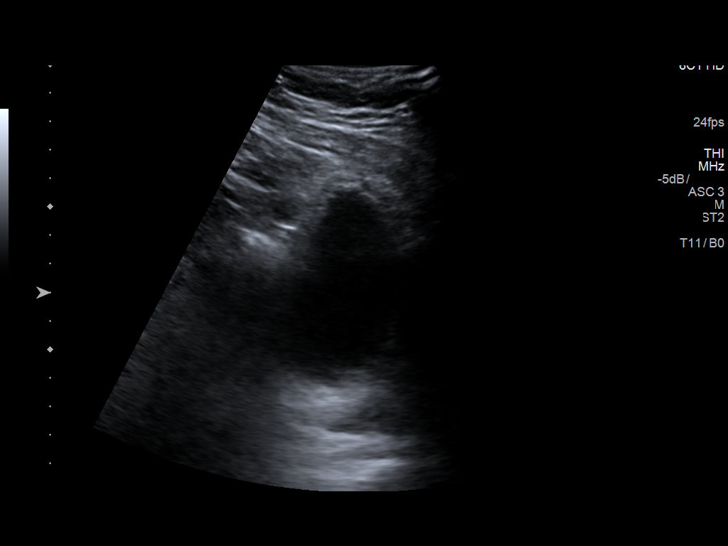
[im 20/22]
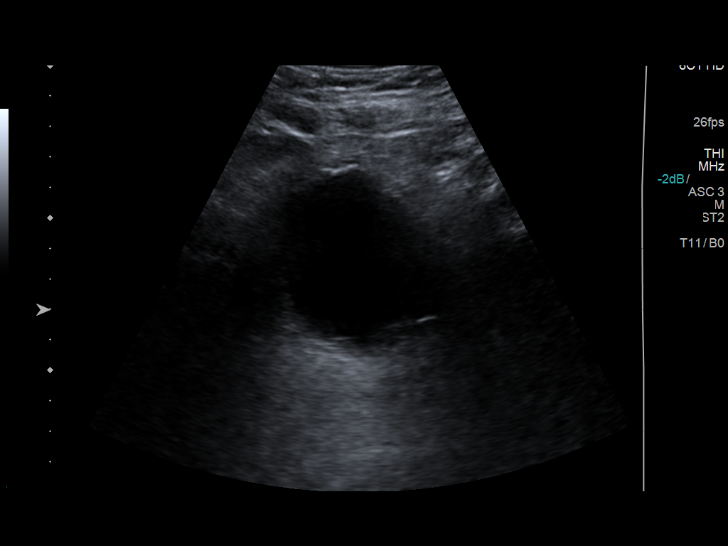
[im 22/22]
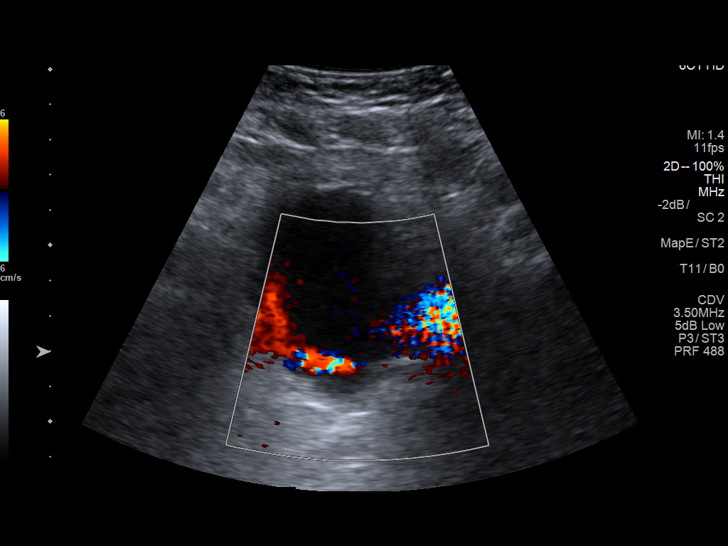

[14 of 22 positions shown; findings below may reference images not displayed]

FINDINGS: Right Kidney:

Length: 10.2 cm. Echogenicity within normal limits. No mass or
hydronephrosis visualized.

Left Kidney:

Length: 9.1 cm. Echogenicity within normal limits. No mass or
hydronephrosis visualized.

Bladder:

Appears normal for degree of bladder distention. BILATERAL ureteral
jets are observed.
IMPRESSION: Negative exam.

## 2020-10-16 ENCOUNTER — Other Ambulatory Visit: Payer: Self-pay | Admitting: Registered Nurse

## 2020-10-16 DIAGNOSIS — I10 Essential (primary) hypertension: Secondary | ICD-10-CM

## 2020-11-09 ENCOUNTER — Ambulatory Visit: Payer: Medicare Other | Admitting: Family Medicine

## 2020-12-05 ENCOUNTER — Ambulatory Visit: Payer: Medicare Other | Admitting: Family Medicine

## 2020-12-05 DIAGNOSIS — Z0289 Encounter for other administrative examinations: Secondary | ICD-10-CM

## 2020-12-27 ENCOUNTER — Other Ambulatory Visit: Payer: Self-pay | Admitting: Urology

## 2021-01-16 ENCOUNTER — Other Ambulatory Visit: Payer: Self-pay | Admitting: Family Medicine

## 2021-01-16 DIAGNOSIS — I10 Essential (primary) hypertension: Secondary | ICD-10-CM

## 2021-01-24 ENCOUNTER — Other Ambulatory Visit: Payer: Self-pay | Admitting: Family Medicine

## 2021-01-24 DIAGNOSIS — I10 Essential (primary) hypertension: Secondary | ICD-10-CM

## 2021-01-29 ENCOUNTER — Other Ambulatory Visit: Payer: Self-pay | Admitting: Family Medicine

## 2021-01-29 DIAGNOSIS — I10 Essential (primary) hypertension: Secondary | ICD-10-CM

## 2021-03-12 ENCOUNTER — Other Ambulatory Visit: Payer: Self-pay | Admitting: Family Medicine

## 2021-03-12 DIAGNOSIS — I10 Essential (primary) hypertension: Secondary | ICD-10-CM

## 2021-04-24 ENCOUNTER — Other Ambulatory Visit: Payer: Self-pay | Admitting: Family Medicine

## 2021-04-24 DIAGNOSIS — I10 Essential (primary) hypertension: Secondary | ICD-10-CM

## 2021-05-22 ENCOUNTER — Other Ambulatory Visit: Payer: Self-pay | Admitting: Registered Nurse

## 2021-05-22 DIAGNOSIS — E1165 Type 2 diabetes mellitus with hyperglycemia: Secondary | ICD-10-CM

## 2021-07-04 ENCOUNTER — Ambulatory Visit (HOSPITAL_COMMUNITY)
Admission: EM | Admit: 2021-07-04 | Discharge: 2021-07-04 | Disposition: A | Discharge status: Home / Self Care | Payer: Medicare Other | Attending: Family Medicine | Admitting: Family Medicine

## 2021-07-04 ENCOUNTER — Encounter (HOSPITAL_COMMUNITY): Payer: Self-pay

## 2021-07-04 ENCOUNTER — Other Ambulatory Visit: Payer: Self-pay

## 2021-07-04 DIAGNOSIS — I10 Essential (primary) hypertension: Secondary | ICD-10-CM | POA: Insufficient documentation

## 2021-07-04 DIAGNOSIS — N1832 Chronic kidney disease, stage 3b: Secondary | ICD-10-CM

## 2021-07-04 DIAGNOSIS — I129 Hypertensive chronic kidney disease with stage 1 through stage 4 chronic kidney disease, or unspecified chronic kidney disease: Secondary | ICD-10-CM

## 2021-07-04 LAB — CBC
HCT: 43.1 % (ref 39.0–52.0)
Hemoglobin: 13.9 g/dL (ref 13.0–17.0)
MCH: 30.6 pg (ref 26.0–34.0)
MCHC: 32.3 g/dL (ref 30.0–36.0)
MCV: 94.9 fL (ref 80.0–100.0)
Platelets: 162 10*3/uL (ref 150–400)
RBC: 4.54 MIL/uL (ref 4.22–5.81)
RDW: 13.2 % (ref 11.5–15.5)
WBC: 9.7 10*3/uL (ref 4.0–10.5)
nRBC: 0 % (ref 0.0–0.2)

## 2021-07-04 LAB — COMPREHENSIVE METABOLIC PANEL
ALT: 11 U/L (ref 0–44)
AST: 13 U/L — ABNORMAL LOW (ref 15–41)
Albumin: 3.5 g/dL (ref 3.5–5.0)
Alkaline Phosphatase: 74 U/L (ref 38–126)
Anion gap: 15 (ref 5–15)
BUN: 18 mg/dL (ref 8–23)
CO2: 29 mmol/L (ref 22–32)
Calcium: 10 mg/dL (ref 8.9–10.3)
Chloride: 99 mmol/L (ref 98–111)
Creatinine, Ser: 1.56 mg/dL — ABNORMAL HIGH (ref 0.61–1.24)
GFR, Estimated: 43 mL/min — ABNORMAL LOW (ref >60)
Glucose, Bld: 104 mg/dL — ABNORMAL HIGH (ref 70–99)
Potassium: 4.4 mmol/L (ref 3.5–5.1)
Sodium: 143 mmol/L (ref 135–145)
Total Bilirubin: 1.2 mg/dL (ref 0.3–1.2)
Total Protein: 7.2 g/dL (ref 6.5–8.1)

## 2021-07-04 MED ORDER — IRBESARTAN 150 MG PO TABS: 150.0000 mg | ORAL_TABLET | Freq: Every day | ORAL | 0 refills | Status: AC

## 2021-07-04 NOTE — Discharge Instructions (Addendum)
Keep follow-up with PCP. I have refilled your blood pressure medication for 3 months which will allow you time to follow-up with Primary Care. Your labs are stable. Keep follow-up with new primary care.

## 2021-07-04 NOTE — ED Provider Notes (Signed)
Lawrence Higgins    CSN: [id number] Arrival date & time: 07/04/21  1540      History   Chief Complaint Chief Complaint  Patient presents with   Medication Reaction    HPI Lawrence Higgins is a 88 y.o. male.   HPI Patient presents today for medication refill of his hypertensive medication.  Patient was previously established at [facility] however their office closed in 2021 and he has not seen a provider since that time.  He has all PCP had refilled his medications and a courtesy a few times however now he is currently out of his blood pressure medication.  He denies any symptoms of chest pain or shortness of breath.  However in review of EMR patient has CKD last creatinine was 1.55 BUN 15 with a GFR 47.  Patient is also a diabetic however reports that he has plenty of his metformin on hand.  He is establishing with Dr. Katherine Roan however his appointment is not until late March. Denies any symptoms of chest pain, shortness of breath, dizziness, or weakness.  Past Medical History:  Diagnosis Date   Diabetes mellitus without complication ([place])    Hypertension    UTI (urinary tract infection) 02/2018    Patient Active Problem List   Diagnosis Date Noted   Hypertension 03/02/2018   Diabetes mellitus ([place]) 03/02/2018   UTI (urinary tract infection) 03/01/2018   Acute renal injury ([place]) 03/01/2018    Past Surgical History:  Procedure Laterality Date   MULTIPLE TOOTH EXTRACTIONS         Home Medications    Prior to Admission medications   Medication Sig Start Date End Date Taking? Authorizing Provider  ACCU-CHEK AVIVA PLUS test strip USE 1 STRIP TO CHECK GLUCOSE TWICE DAILY 06/03/18   [provider]  atorvastatin (LIPITOR) 10 MG tablet Take 1 tablet (10 mg total) by mouth daily at 6 PM. 04/30/19   Wendie Agreste, MD  irbesartan (AVAPRO) 150 MG tablet Take 1 tablet (150 mg total) by mouth daily. 07/04/21 10/02/21  Scot Jun, FNP  metFORMIN  (GLUCOPHAGE) 500 MG tablet TAKE 1 TABLET BY MOUTH TWICE DAILY WITH A MEAL . APPOINTMENT REQUIRED FOR FUTURE REFILLS 05/23/21   Maximiano Coss, NP  pantoprazole (PROTONIX) 20 MG tablet Take 20 mg by mouth daily as needed for heartburn or indigestion.  02/09/18   [provider]  tamsulosin (FLOMAX) 0.4 MG CAPS capsule Take 1 capsule by mouth at bedtime 01/04/21   McKenzie, Candee Furbish, MD    Family History History reviewed. No pertinent family history.  Social History Social History   Tobacco Use   Smoking status: Former    Types: Cigarettes    Quit date: 02/04/1978    Years since quitting: 43.4   Smokeless tobacco: Never  Vaping Use   Vaping Use: Never used  Substance Use Topics   Alcohol use: Not Currently   Drug use: Never     Allergies   Tylenol [acetaminophen] and Lisinopril   Review of Systems Review of Systems Pertinent negatives listed in HPI Physical Exam Triage Vital Signs ED Triage Vitals [07/04/21 1621]  Enc Vitals Group     BP 140/78     Pulse Rate 79     Resp 18     Temp 98.7 F (37.1 C)     Temp Source Oral     SpO2 93 %     Weight      Height  Head Circumference      Peak Flow      Pain Score 0     Pain Loc      Pain Edu?      Excl. in [place]?    No data found.  Updated Vital Signs BP 140/78 (BP Location: Left Arm)    Pulse 79    Temp 98.7 F (37.1 C) (Oral)    Resp 18    SpO2 93%   Visual Acuity Right Eye Distance:   Left Eye Distance:   Bilateral Distance:    Right Eye Near:   Left Eye Near:    Bilateral Near:     Physical Exam Constitutional:      Appearance: Normal appearance.  HENT:     Head: Normocephalic and atraumatic.     Nose: Nose normal.  Eyes:     Extraocular Movements: Extraocular movements intact.     Pupils: Pupils are equal, round, and reactive to light.  Cardiovascular:     Rate and Rhythm: Normal rate and regular rhythm.  Pulmonary:     Effort: Pulmonary effort is normal.     Breath sounds: Normal  breath sounds.  Musculoskeletal:     Cervical back: Normal range of motion and neck supple.  Skin:    General: Skin is warm.     Capillary Refill: Capillary refill takes less than 2 seconds.  Neurological:     General: No focal deficit present.     Mental Status: He is alert.  Psychiatric:        Mood and Affect: Mood normal.        Behavior: Behavior normal.        Thought Content: Thought content normal.        Judgment: Judgment normal.     UC Treatments / Results  Labs (all labs ordered are listed, but only abnormal results are displayed) Labs Reviewed  COMPREHENSIVE METABOLIC PANEL - Abnormal; Notable for the following components:      Result Value   Glucose, Bld 104 (*)    Creatinine, Ser 1.56 (*)    AST 13 (*)    GFR, Estimated 43 (*)    All other components within normal limits  CBC    EKG   Radiology No results found.  Procedures Procedures (including critical care time)  Medications Ordered in UC Medications - No data to display  Initial Impression / Assessment and Plan / UC Course  I have reviewed the triage vital signs and the nursing notes.  Pertinent labs & imaging results that were available during my care of the patient were reviewed by me and considered in my medical decision making (see chart for details).    Essential hypertension & CKD 3 Refilled irbesartan for 90 days. Advised to follow-up with PCP as scheduled.  RTC PRN  Final Clinical Impressions(s) / UC Diagnoses   Final diagnoses:  Essential hypertension  Stage 3b chronic kidney disease [facility])     Discharge Instructions      Keep follow-up with PCP. I have refilled your blood pressure medication for 3 months which will allow you time to follow-up with Primary Care. Your labs are stable. Keep follow-up with new primary care.     ED Prescriptions     Medication Sig Dispense Auth. Provider   irbesartan (AVAPRO) 150 MG tablet Take 1 tablet (150 mg total) by mouth daily. 90  tablet Scot Jun, FNP      PDMP not reviewed this encounter.  Scot Jun, [place] 07/06/21 [phone]

## 2021-07-04 NOTE — ED Triage Notes (Signed)
Pt requesting refill on his  Irbesartan until he can see his new PCP. States been out for 1wk. Denies any sx's except a dry cough and now and then.

## 2021-07-19 ENCOUNTER — Other Ambulatory Visit: Payer: Self-pay | Admitting: Registered Nurse

## 2021-07-19 DIAGNOSIS — E1165 Type 2 diabetes mellitus with hyperglycemia: Secondary | ICD-10-CM

## 2021-12-10 ENCOUNTER — Encounter (HOSPITAL_COMMUNITY): Payer: Self-pay

## 2021-12-10 ENCOUNTER — Ambulatory Visit (HOSPITAL_COMMUNITY)
Admission: EM | Admit: 2021-12-10 | Discharge: 2021-12-10 | Disposition: A | Discharge status: Home / Self Care | Payer: Medicare Other | Attending: Student | Admitting: Student

## 2021-12-10 DIAGNOSIS — E119 Type 2 diabetes mellitus without complications: Secondary | ICD-10-CM | POA: Diagnosis not present

## 2021-12-10 DIAGNOSIS — Z7984 Long term (current) use of oral hypoglycemic drugs: Secondary | ICD-10-CM | POA: Diagnosis not present

## 2021-12-10 DIAGNOSIS — Z760 Encounter for issue of repeat prescription: Secondary | ICD-10-CM

## 2021-12-10 DIAGNOSIS — E1165 Type 2 diabetes mellitus with hyperglycemia: Secondary | ICD-10-CM | POA: Diagnosis not present

## 2021-12-10 DIAGNOSIS — E1169 Type 2 diabetes mellitus with other specified complication: Secondary | ICD-10-CM

## 2021-12-10 LAB — CBG MONITORING, ED: Glucose-Capillary: 138 mg/dL — ABNORMAL HIGH (ref 70–99)

## 2021-12-10 MED ORDER — ACCU-CHEK AVIVA PLUS VI STRP
ORAL_STRIP | 0 refills | Status: AC
Start: 2021-12-10 — End: ?

## 2021-12-10 MED ORDER — METFORMIN HCL 500 MG PO TABS: 500.0000 mg | ORAL_TABLET | Freq: Every day | ORAL | 0 refills | Status: AC

## 2021-12-10 NOTE — ED Triage Notes (Signed)
Pt is requesting a refill on metformin and Accu-Check testing strips.
